# Patient Record
Sex: Female | Born: 1985 | Race: Black or African American | Hispanic: No | Marital: Single | Smoking: Current some day smoker
Health system: Southern US, Community
[De-identification: ages and names within clinical notes are randomized; demographics above are authoritative.]

## PROBLEM LIST (undated history)

## (undated) DIAGNOSIS — Z789 Other specified health status: Secondary | ICD-10-CM

## (undated) DIAGNOSIS — L0232 Furuncle of buttock: Secondary | ICD-10-CM

## (undated) HISTORY — DX: Other specified health status: Z78.9

## (undated) HISTORY — PX: INCISE AND DRAIN ABCESS: PRO64

---
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
## 2008-05-23 ENCOUNTER — Ambulatory Visit: Payer: Self-pay | Admitting: Obstetrics and Gynecology

## 2008-05-23 ENCOUNTER — Inpatient Hospital Stay (HOSPITAL_COMMUNITY): Admission: AD | Admit: 2008-05-23 | Discharge: 2008-05-23 | Payer: Self-pay | Admitting: Obstetrics & Gynecology

## 2009-04-12 ENCOUNTER — Ambulatory Visit: Payer: Self-pay | Admitting: Obstetrics and Gynecology

## 2009-04-13 ENCOUNTER — Encounter: Payer: Self-pay | Admitting: Obstetrics and Gynecology

## 2009-04-13 LAB — CONVERTED CEMR LAB
HCT: 36.8 % (ref 36.0–46.0)
Hemoglobin: 12 g/dL (ref 12.0–15.0)
MCV: 88.7 fL (ref 78.0–100.0)
Platelets: 293 10*3/uL (ref 150–400)
RBC: 4.15 M/uL (ref 3.87–5.11)
WBC: 7.1 10*3/uL (ref 4.0–10.5)

## 2009-04-18 ENCOUNTER — Ambulatory Visit (HOSPITAL_COMMUNITY): Admission: RE | Admit: 2009-04-18 | Discharge: 2009-04-18 | Payer: Self-pay | Admitting: Obstetrics and Gynecology

## 2009-05-03 ENCOUNTER — Ambulatory Visit: Payer: Self-pay | Admitting: Obstetrics and Gynecology

## 2010-06-14 LAB — URINALYSIS, ROUTINE W REFLEX MICROSCOPIC
Ketones, ur: NEGATIVE mg/dL
Leukocytes, UA: NEGATIVE
Nitrite: NEGATIVE
Protein, ur: NEGATIVE mg/dL
Specific Gravity, Urine: 1.02 (ref 1.005–1.030)
Urobilinogen, UA: 0.2 mg/dL (ref 0.0–1.0)
pH: 6 (ref 5.0–8.0)

## 2010-06-14 LAB — WET PREP, GENITAL

## 2010-06-14 LAB — URINE MICROSCOPIC-ADD ON

## 2010-07-20 NOTE — Op Note (Signed)
NAME:  Alexis [name], Alexis [name]                        ACCOUNT NO.:  [id number]   MEDICAL RECORD NO.:  [id number]                   PATIENT TYPE:  AMB   LOCATION:  DSC                                  FACILITY:  [facility]   PHYSICIAN:  Leonia Corona, M.D.               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   POSTOPERATIVE DIAGNOSIS:  Pilonidal cyst and sinus with history of  infection.   PROCEDURE PERFORMED:  Excision of pilonidal cyst and sinus.   ANESTHESIA:  General endotracheal anesthesia.   SURGEON:  Leonia Corona, M.D.   ASSISTANT:  Nurse.   INDICATIONS FOR PROCEDURE:  This 25 year old female child was seen in the  office for painful cystic swelling with discharge from the lower back in the  midline clinically indicative of pilonidal cyst with visible sinus and  drainage.  The patient was treated with antibiotic.  Now, the infection has  subsided and the patient was ready for excision of the cyst and sinus.   PROCEDURE IN DETAIL:  The patient is brought in the operating room, general  endotracheal anesthesia was induced on the patient's stretcher and the  patient was later placed in a prone position on the operating table.  All  the pressure points were well protected with necessary padding.  The tapes  were applied over the buttocks to spread and expose the pilonidal cyst  completely.  The area was cleaned, prepped and draped in the usual manner.  The cyst was marked with a marking pen.  An elliptical vertical incision was  marked and closing the sinus at the lower end of the elliptical incision.  The incision was made with the knife, deepened through the subcutaneous  tissue using electrocautery.  Further deeper dissection was carried out by  blunt and sharp dissection using cautery to control the bleeding.   The  elliptical piece of skin was dissected along with the entire cyst in one  piece.  A blunt tip probe was passed through the sinus and it went into the  cyst which was very fragile and friable wall in the lower part of the  dissection which was removed along with the subcutaneous fat as far deep as  the bone.  Little pieces of the fragile wall of the cyst had to be curettage  since it was very friable and did not come out in one piece.  The area was  once again inspected and complete curettage was done and no nonhealthy  tissue was noted, no cyst wall was left behind, the entire cyst wall was  excised completely.  The bleeding spots were picked up and cauterized.  The  cavity created measured about 6-7 cm in length and about 4-5 cm wide.  In  view  of the infection, we decided to keep the cavity wide open with packing.  Dilute hydrogen peroxide irrigation of the cavity was done and suctioned out  completely and no active bleeders or oozing was noted.  The cavity was  packed with 1 inch Iodoform gauze.  Tight packing was done.  Two loose  stitches using 0 nylon were placed in upper and lower end of the elliptical  wound to be tied later on which was left on a 2 by 2 buttress and placed on  the skin with Tegaderm dressing.  The open wound with  packing was covered with a sterile gauze and ABD pad and secured with  Hypofix dressing.  The patient tolerated the procedure well which was smooth  and uneventful.  The patient was later extubated and transferred to the  recovery room in good, stable condition.                                               Leonia Corona, M.D.    SF/MEDQ  D:  10/21/2002  T:  10/21/2002  Job:  [id number]   cc:   Melissa Noon, M.D.

## 2011-01-02 ENCOUNTER — Encounter (HOSPITAL_COMMUNITY): Payer: Self-pay | Admitting: *Deleted

## 2011-01-02 ENCOUNTER — Inpatient Hospital Stay (HOSPITAL_COMMUNITY): Payer: Self-pay

## 2011-01-02 ENCOUNTER — Inpatient Hospital Stay (HOSPITAL_COMMUNITY)
Admission: AD | Admit: 2011-01-02 | Discharge: 2011-01-02 | Disposition: A | Discharge status: Home / Self Care | Payer: Self-pay | Source: Ambulatory Visit | Attending: Obstetrics and Gynecology | Admitting: Obstetrics and Gynecology

## 2011-01-02 ENCOUNTER — Inpatient Hospital Stay (INDEPENDENT_AMBULATORY_CARE_PROVIDER_SITE_OTHER)
Admission: RE | Admit: 2011-01-02 | Discharge: 2011-01-02 | Disposition: A | Discharge status: Home / Self Care | Payer: Self-pay | Source: Ambulatory Visit | Attending: Family Medicine | Admitting: Family Medicine

## 2011-01-02 DIAGNOSIS — O30009 Twin pregnancy, unspecified number of placenta and unspecified number of amniotic sacs, unspecified trimester: Secondary | ICD-10-CM | POA: Insufficient documentation

## 2011-01-02 DIAGNOSIS — O30001 Twin pregnancy, unspecified number of placenta and unspecified number of amniotic sacs, first trimester: Secondary | ICD-10-CM

## 2011-01-02 DIAGNOSIS — R1032 Left lower quadrant pain: Secondary | ICD-10-CM

## 2011-01-02 DIAGNOSIS — R109 Unspecified abdominal pain: Secondary | ICD-10-CM | POA: Insufficient documentation

## 2011-01-02 DIAGNOSIS — N949 Unspecified condition associated with female genital organs and menstrual cycle: Secondary | ICD-10-CM

## 2011-01-02 DIAGNOSIS — O9989 Other specified diseases and conditions complicating pregnancy, childbirth and the puerperium: Secondary | ICD-10-CM

## 2011-01-02 DIAGNOSIS — O26899 Other specified pregnancy related conditions, unspecified trimester: Secondary | ICD-10-CM

## 2011-01-02 DIAGNOSIS — O99891 Other specified diseases and conditions complicating pregnancy: Secondary | ICD-10-CM | POA: Insufficient documentation

## 2011-01-02 DIAGNOSIS — Z331 Pregnant state, incidental: Secondary | ICD-10-CM

## 2011-01-02 HISTORY — DX: Furuncle of buttock: L02.32

## 2011-01-02 LAB — POCT URINALYSIS DIP (DEVICE)
Bilirubin Urine: NEGATIVE
Glucose, UA: NEGATIVE mg/dL
Hgb urine dipstick: NEGATIVE
Leukocytes, UA: NEGATIVE
Protein, ur: NEGATIVE mg/dL
Specific Gravity, Urine: 1.015 (ref 1.005–1.030)

## 2011-01-02 LAB — ABO/RH: ABO/RH(D): A POS

## 2011-01-02 LAB — CBC
MCH: 30.9 pg (ref 26.0–34.0)
MCHC: 34 g/dL (ref 30.0–36.0)
Platelets: 272 10*3/uL (ref 150–400)
RBC: 3.79 MIL/uL — ABNORMAL LOW (ref 3.87–5.11)
RDW: 14.1 % (ref 11.5–15.5)

## 2011-01-02 LAB — HCG, QUANTITATIVE, PREGNANCY: hCG, Beta Chain, Quant, S: 18400 m[IU]/mL — ABNORMAL HIGH (ref <5)

## 2011-01-02 LAB — WET PREP, GENITAL: Trich, Wet Prep: NONE SEEN

## 2011-01-02 IMAGING — US US OB COMP LESS 14 WK
1 series · 14 of 28 positions shown · non-contrast
Comparison: None.

CLINICAL DATA: Pelvic pain.  Pregnant.

OBSTETRIC <14 WK US AND TRANSVAGINAL OB US
TECHNIQUE: Both transabdominal and transvaginal ultrasound
examinations were performed for complete evaluation of the
gestation as well as the maternal uterus, adnexal regions, and
pelvic cul-de-sac.  Transvaginal technique was performed to assess
early pregnancy.

[Series 1: us ob comp less 14 wks · 49 acquisitions, 14 frames shown]
[im 2/49]
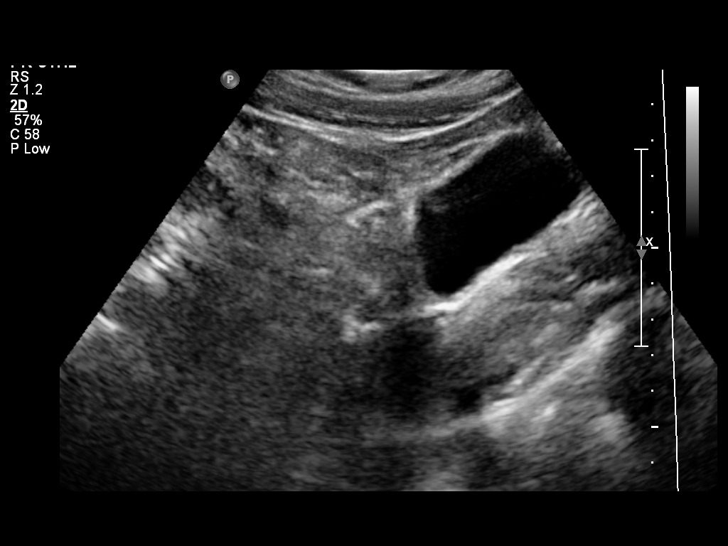
[im 6/49]
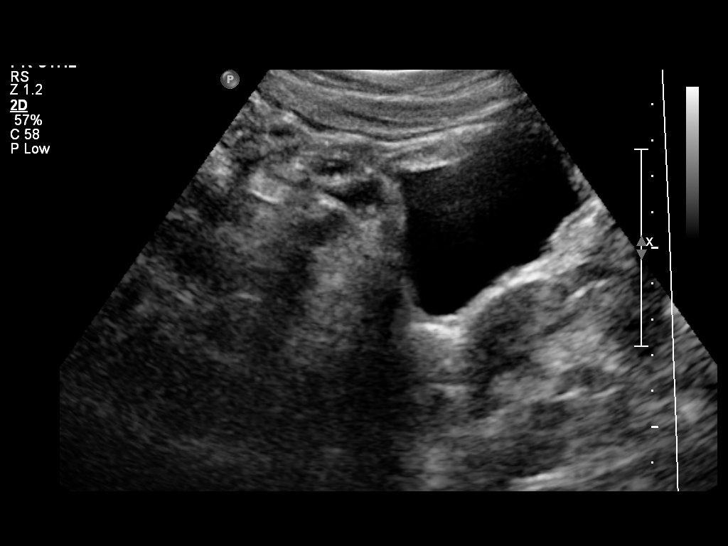
[im 9/49]
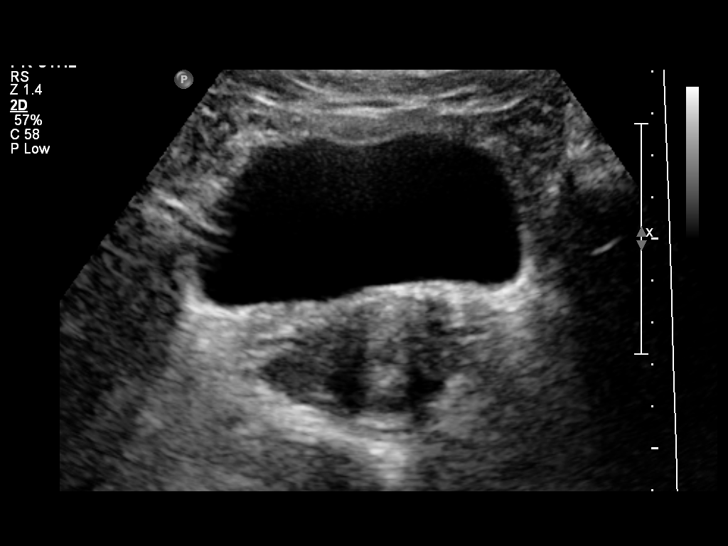
[im 13/49]
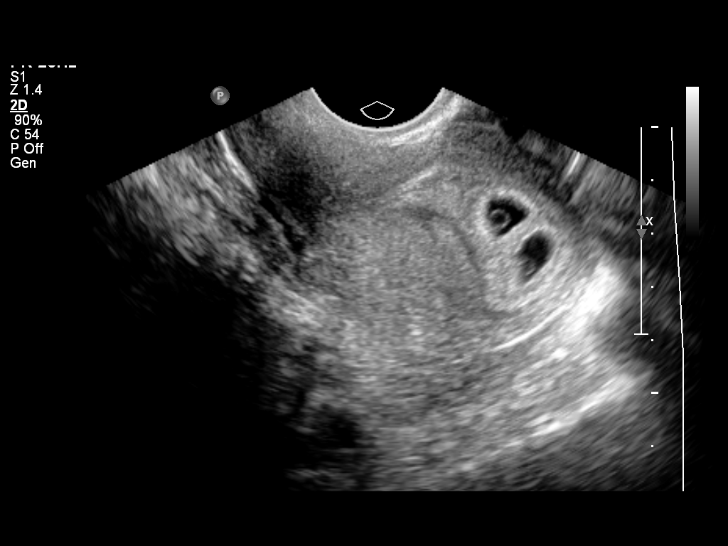
[im 17/49]
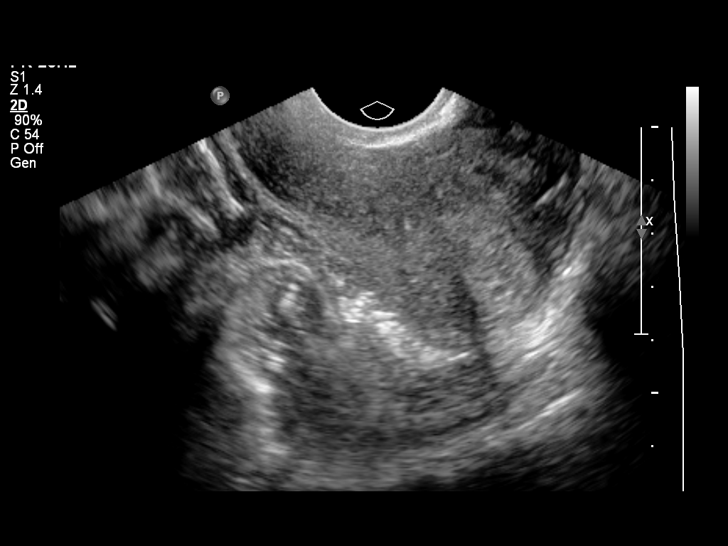
[im 20/49]
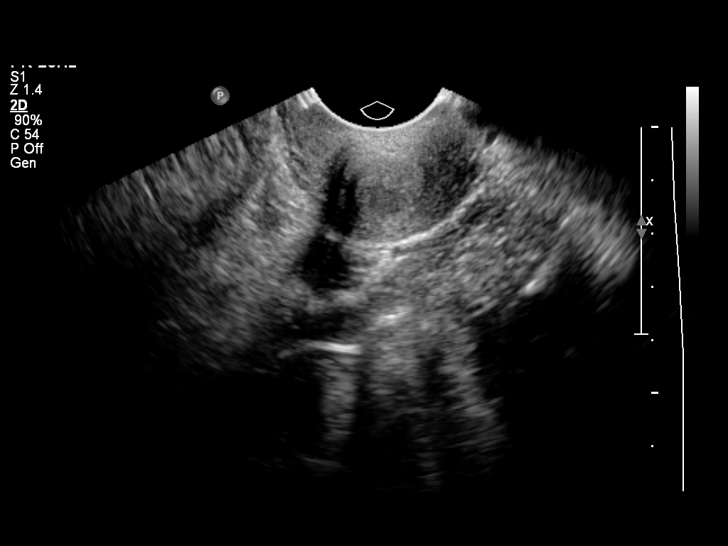
[im 24/49]
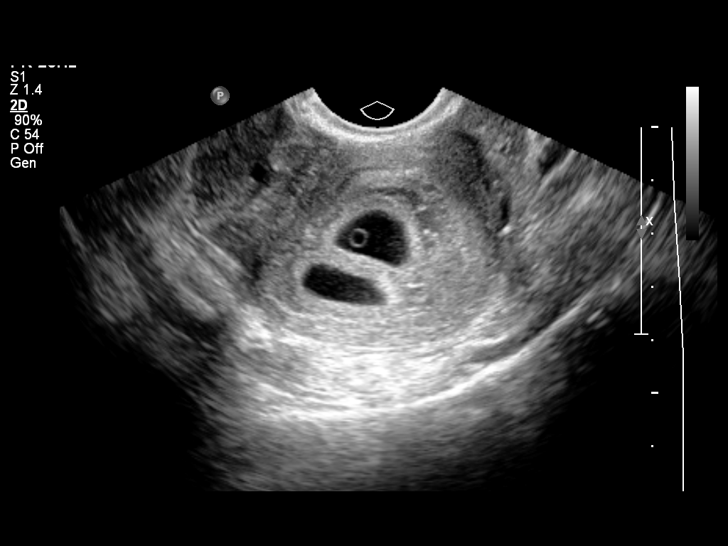
[im 27/49]
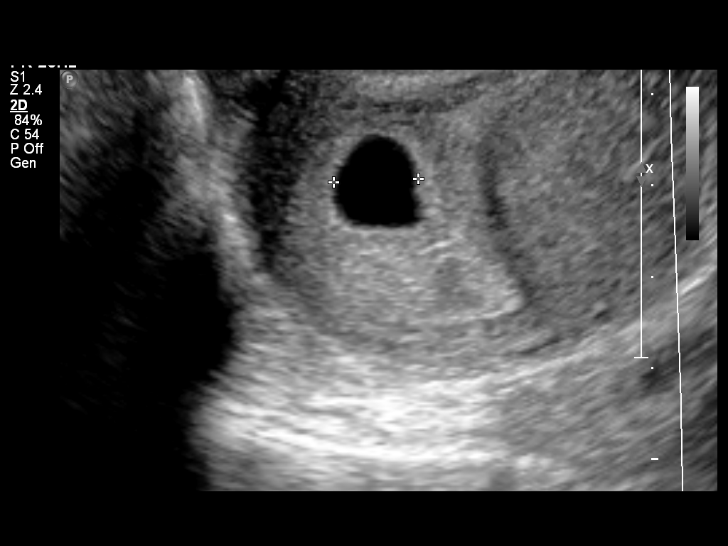
[im 31/49]
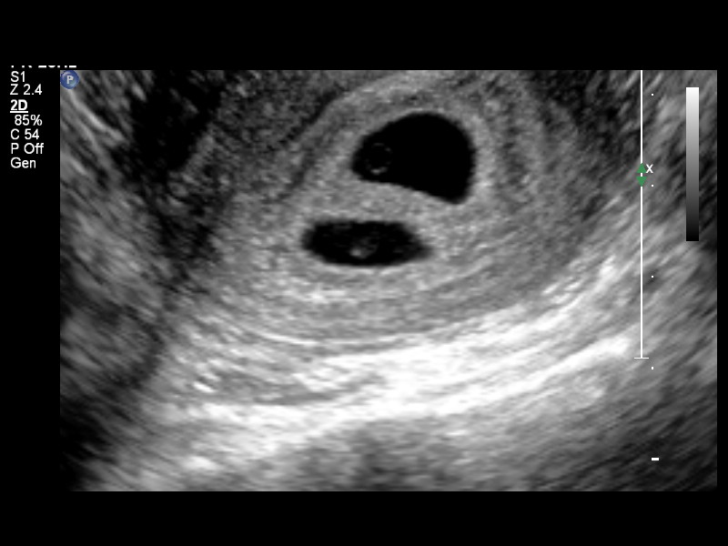
[im 34/49]
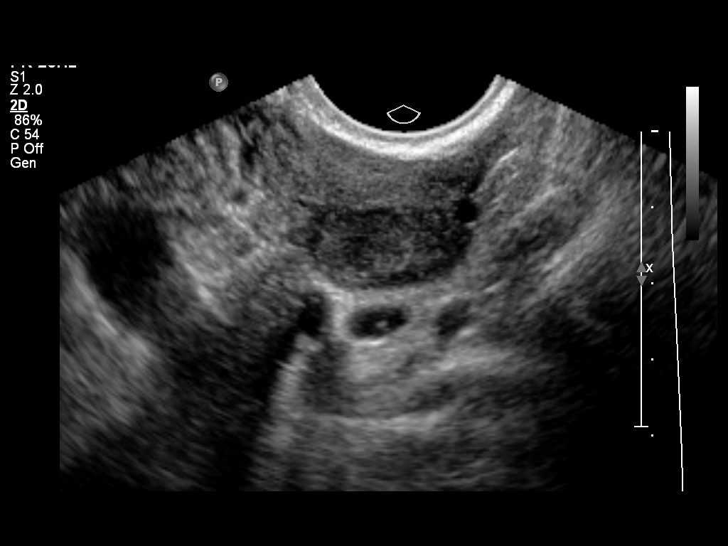
[im 38/49]
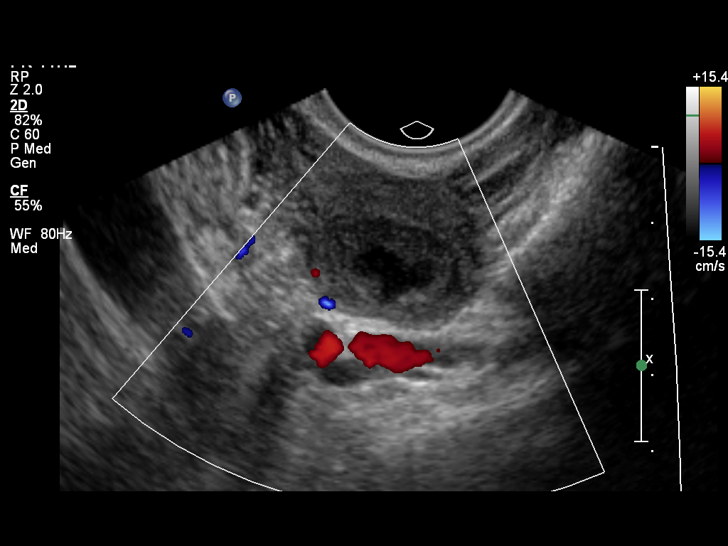
[im 41/49]
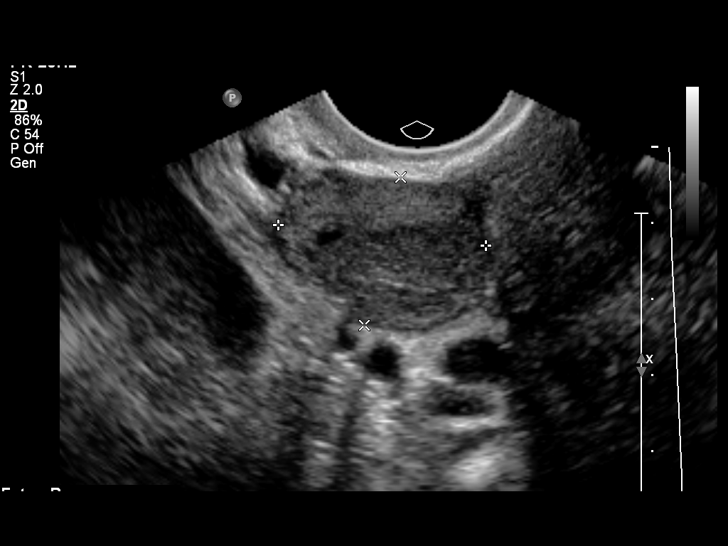
[im 45/49]
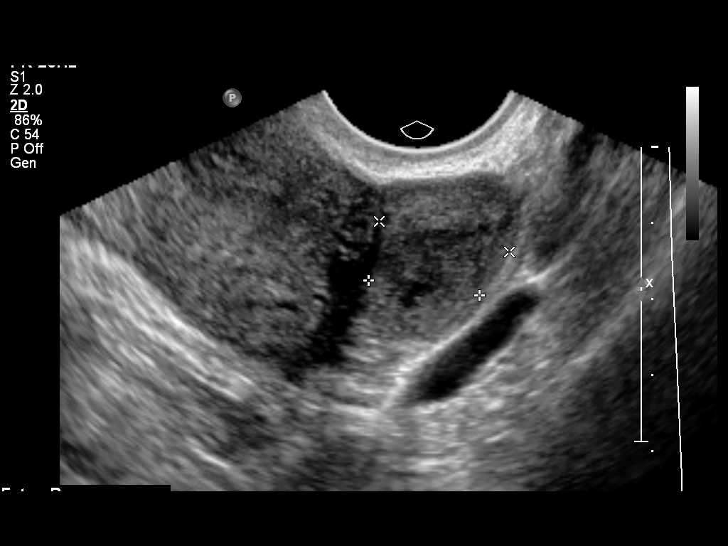
[im 49/49]
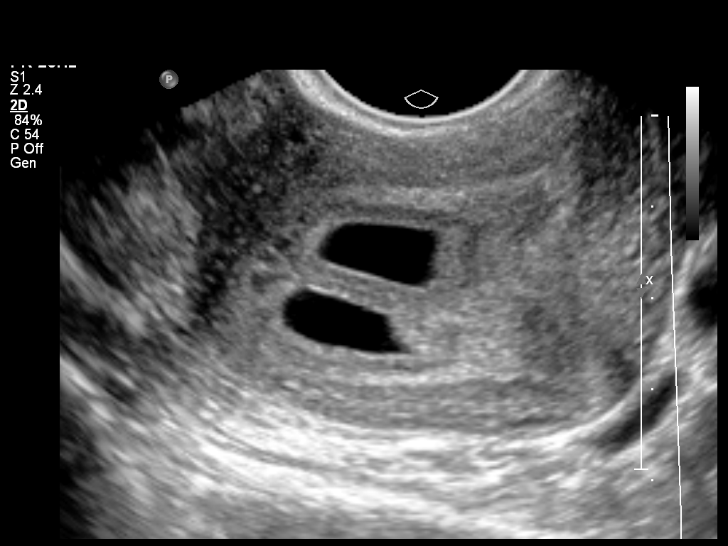

[14 of 28 positions shown; findings below may reference images not displayed]

Intrauterine gestational sac:  Two intrauterine gestational sacs.
Yolk sac: Present x2.
Embryo: None
Cardiac Activity: N/A
Heart Rate: N/A bpm

MSD: 10.3  mm  5    w 5    d.  Both gestational sacs are identical
in size.          US EDC: [DATE].

Maternal uterus/adnexae:
No subchorionic hemorrhage.
Normal ovaries.  Corpus luteum cysts noted.
No free pelvic fluid collections.
IMPRESSION: Twin pregnancy with two separate gestational sacs and yolk sacs but
no embryo identified.  Gestational sacs estimated 5 weeks and 5
days gestation.

## 2011-01-02 NOTE — Progress Notes (Signed)
"  I have been having LT side and lower abd pain for 2 weeks.  I had a (+) pregnancy test at home and went to Urgent Care just to confirm and make sure the test I took at home wasn't wrong.  I just left [facility].  They told me to come directly here for evaluation."

## 2011-01-02 NOTE — Progress Notes (Signed)
Pt states she thought she was pregnant and went to [facility]-they confirmed the preg. And told her to come here for further eval of her pain

## 2011-01-02 NOTE — ED Provider Notes (Signed)
History     Chief Complaint  Patient presents with  . Abdominal Pain   HPIRaquel S Barnett25 y.o. presents after being seen at  [facility] for abdominal pain.  She suspected she was pregnant.  They sent her here for evaluation of her pain.  Pain began 2 weeks ago.  Describes as "it is like someone is pushing on my stomach real hard".  LMP 9/26.  G1.  Was not using. Denies vaginal bleeding.  Has not taken anything for the pain.  Nauseated X 1 week but denies vomiting.  Plans care at [facility].       Past Medical History  Diagnosis Date  . Boil of buttock 2002    at the top between buttocks, abcess I&D    No past surgical history on file.  No family history on file.  History  Substance Use Topics  . Smoking status: Not on file  . Smokeless tobacco: Not on file  . Alcohol Use:     Allergies: Allergies not on file  No prescriptions prior to admission    Review of Systems  Constitutional: Negative.   Gastrointestinal: Negative for vomiting.  Genitourinary: Negative.        Negative for vaginal bleeding and vaginal discharge   Physical Exam   Blood pressure 112/77, pulse 72, temperature 97.9 F (36.6 C), resp. rate 20, height 5\' 3"  (1.6 m), weight 177 lb (80.287 kg), last menstrual period 11/28/2010.  Physical Exam  Constitutional: She is oriented to person, place, and time. She appears well-developed and well-nourished. No distress.  HENT:  Head: Normocephalic.  Neck: Normal range of motion.  Cardiovascular: Normal rate.   Respiratory: Effort normal.  GI: Soft. She exhibits no distension. There is no tenderness. There is no rebound and no guarding.  Genitourinary: Uterus is enlarged. Uterus is not tender. Cervix exhibits friability. Right adnexum displays no mass, no tenderness and no fullness. Left adnexum displays no mass, no tenderness and no fullness. No tenderness or bleeding around the vagina. Vaginal discharge (small amount of white discharge without odor) found.   Neurological: She is alert and oriented to person, place, and time.  Skin: Skin is warm and dry.    Study Result     *RADIOLOGY REPORT*  Clinical Data: Pelvic pain. Pregnant.  OBSTETRIC <14 WK [place] AND TRANSVAGINAL OB US  Technique: Both transabdominal and transvaginal ultrasound  examinations were performed for complete evaluation of the  gestation as well as the maternal uterus, adnexal regions, and  pelvic cul-de-sac. Transvaginal technique was performed to assess  early pregnancy.  Comparison: None.  Intrauterine gestational sac: Two intrauterine gestational sacs.  Yolk sac: Present x2.  Embryo: None  Cardiac Activity: N/A  Heart Rate: N/A bpm  MSD: 10.3 mm 5 w 5 d. Both gestational sacs are identical  in size. [place] EDC: 08/30/2011.  Maternal uterus/adnexae:  No subchorionic hemorrhage.  Normal ovaries. Corpus luteum cysts noted.  No free pelvic fluid collections.  IMPRESSION:  Twin pregnancy with two separate gestational sacs and yolk sacs but  no embryo identified. Gestational sacs estimated 5 weeks and 5  days gestation.  Original Report Authenticated By: P. Loralie Champagne, M.D.      Imaging    Results for orders placed during the hospital encounter of 01/02/11 (from the past 24 hour(s))  WET PREP, GENITAL     Status: Abnormal   Collection Time   01/02/11  8:05 PM      Component Value Range   Yeast, Wet  Prep NONE SEEN  NONE SEEN    Trich, Wet Prep NONE SEEN  NONE SEEN    Clue Cells, Wet Prep FEW (*) NONE SEEN    WBC, Wet Prep HPF POC FEW (*) NONE SEEN   CBC     Status: Abnormal   Collection Time   01/02/11  8:10 PM      Component Value Range   WBC 8.3  4.0 - 10.5 (K/uL)   RBC 3.79 (*) 3.87 - 5.11 (MIL/uL)   Hemoglobin 11.7 (*) 12.0 - 15.0 (g/dL)   HCT 40.9 (*) 81.1 - 46.0 (%)   MCV 90.8  78.0 - 100.0 (fL)   MCH 30.9  26.0 - 34.0 (pg)   MCHC 34.0  30.0 - 36.0 (g/dL)   RDW 91.4  78.2 - 95.6 (%)   Platelets 272  150 - 400 (K/uL)  ABO/RH     Status: Normal     Collection Time   01/02/11  8:10 PM      Component Value Range   ABO/RH(D) A POS     BHCG Pending at the time of discharge.  MAU Course  Procedures  GC/CHl culture to lab  MDM  Assessment and Plan  A:  Twin pregnancy at [redacted]w[redacted]d  P:  Begin prenatal care as planned.  Pt aware we will report + Cultures.    Damoni Causby,EVE M 01/02/2011, 7:46 PM   Matt Holmes, NP 01/02/11 2054

## 2011-01-07 ENCOUNTER — Encounter (HOSPITAL_COMMUNITY): Payer: Self-pay | Admitting: *Deleted

## 2011-01-28 ENCOUNTER — Other Ambulatory Visit (HOSPITAL_COMMUNITY): Payer: Self-pay | Admitting: Physician Assistant

## 2011-01-28 DIAGNOSIS — O3680X0 Pregnancy with inconclusive fetal viability, not applicable or unspecified: Secondary | ICD-10-CM

## 2011-01-28 LAB — HEPATITIS B SURFACE ANTIGEN: Hepatitis B Surface Ag: NEGATIVE

## 2011-01-28 LAB — RUBELLA ANTIBODY, IGM: Rubella: IMMUNE

## 2011-01-30 ENCOUNTER — Ambulatory Visit (HOSPITAL_COMMUNITY)
Admission: RE | Admit: 2011-01-30 | Discharge: 2011-01-30 | Disposition: A | Discharge status: Home / Self Care | Payer: Medicaid Other | Source: Ambulatory Visit | Attending: Physician Assistant | Admitting: Physician Assistant

## 2011-01-30 ENCOUNTER — Other Ambulatory Visit (HOSPITAL_COMMUNITY): Payer: Self-pay | Admitting: Physician Assistant

## 2011-01-30 DIAGNOSIS — O3680X0 Pregnancy with inconclusive fetal viability, not applicable or unspecified: Secondary | ICD-10-CM

## 2011-01-30 DIAGNOSIS — Z3689 Encounter for other specified antenatal screening: Secondary | ICD-10-CM | POA: Insufficient documentation

## 2011-01-30 DIAGNOSIS — Z3682 Encounter for antenatal screening for nuchal translucency: Secondary | ICD-10-CM

## 2011-01-30 DIAGNOSIS — O30009 Twin pregnancy, unspecified number of placenta and unspecified number of amniotic sacs, unspecified trimester: Secondary | ICD-10-CM | POA: Insufficient documentation

## 2011-02-12 DIAGNOSIS — O30009 Twin pregnancy, unspecified number of placenta and unspecified number of amniotic sacs, unspecified trimester: Secondary | ICD-10-CM | POA: Insufficient documentation

## 2011-02-14 ENCOUNTER — Ambulatory Visit (INDEPENDENT_AMBULATORY_CARE_PROVIDER_SITE_OTHER): Payer: Medicaid Other | Admitting: Obstetrics and Gynecology

## 2011-02-14 DIAGNOSIS — Z7722 Contact with and (suspected) exposure to environmental tobacco smoke (acute) (chronic): Secondary | ICD-10-CM

## 2011-02-14 DIAGNOSIS — O30009 Twin pregnancy, unspecified number of placenta and unspecified number of amniotic sacs, unspecified trimester: Secondary | ICD-10-CM

## 2011-02-14 DIAGNOSIS — T59811A Toxic effect of smoke, accidental (unintentional), initial encounter: Secondary | ICD-10-CM

## 2011-02-14 DIAGNOSIS — O0990 Supervision of high risk pregnancy, unspecified, unspecified trimester: Secondary | ICD-10-CM | POA: Insufficient documentation

## 2011-02-14 LAB — POCT URINALYSIS DIP (DEVICE)
Hgb urine dipstick: NEGATIVE
Nitrite: NEGATIVE
Urobilinogen, UA: 1 mg/dL (ref 0.0–1.0)
pH: 6 (ref 5.0–8.0)

## 2011-02-14 MED ORDER — FERROUS SULFATE 325 (65 FE) MG PO TABS: 325.0000 mg | ORAL_TABLET | Freq: Two times a day (BID) | ORAL | Status: DC

## 2011-02-14 NOTE — Progress Notes (Signed)
Nutrition Note: (Referral for 1st [facility] visit; pt receives [facility] services) Dx. Twin gestation, low iron, obesity w/ wt loss. Pt currently has an 11# wt loss at [redacted]w[redacted]d gestation.   Pt reports vomiting only 1x per week, good appetite with at least 4 meals/ 24 hours.  Disc overall wt gain goals of 25-42# for twin gestation.  Pt will continue intake as reported, enc increased intake to 5 smaller meals, when able.  Follow up in 4 weeks.  Cy Blamer, RD

## 2011-02-14 NOTE — Progress Notes (Signed)
Patient doing well without complaints. Denies nausea or emesis. Patient counseled on twin pregnancy with the need for extra 300 kcal/day, and iron supplementation as well as risk of PTL. Patient advised to avoid second hand smoke as much as possible. Patient interested in first trimester screen which is scheduled for 12/20.

## 2011-02-14 NOTE — Patient Instructions (Signed)
Multiple Pregnancy A multiple pregnancy is when a woman is pregnant with two or more fetuses. Multiple pregnancies occur in about 3% of all births. The more babies in a pregnancy, the greater the risks of problems to the babies and mother. This includes death. Since the use of Assisted Reproductive Technology (ART) and medications that can induce ovulation, multiple fetal gestation has increased.  RISKS TO THE MOTHER  Preeclampsia and eclampsia.   Postpartum bleeding (hemorrhage).   Kidney infection (pyelonephritis).   Develop anemia.   Develop diabetes.   Liver complications.   A blood clot blocks the artery, or branch of the artery leading to the lungs (pulmonary embolism).   Blood clots in the leg.   Placental separation.   Higher rate of Cesarean Section deliveries.   Women over 51 years old have a higher rate of Downs Syndrome babies.  RISKS TO THE BABY  Preterm labor with a premature baby.   Very low birth weight babies that are less than 3 pounds, especially with triplets or mores.   Premature rupture of the membranes.   Twin to twin blood transfusion with one baby anemic and the other baby with too much blood in its system. There may also be heart failure.   With triplets or more, one of the babies is at high risk for cerebral palsy or other neurologic problem.   There is a higher incidence of fetal death.  CARE OF MOTHERS WITH MULTIPLE FETAL GESTATION Multiple pregnancies need more care and special prenatal care.  You will see your caregiver more often.   You will have more tests including ultrasounds, nonstress tests and blood tests.   You will have special tests done called amniocentesis and a biophysical profile.   You may be hospitalized more often during the pregnancy.   You will be encouraged to eat a balanced and healthy diet with vitamin and mineral supplements as directed.   You will be asked to get more rest and sleep to keep up your energy.    You will be asked to restrict your daily activities, exercise, work, household chores and sexual activity.   If you have preterm labor with small babies, you will be given a steroid injection to help the babies lungs mature and do better when born.   The delivery may have to be by Cesarean delivery, especially if there are triplets or more.   The delivery should be in a hospital with an intensive care nursery and Neonatologists (pediatrician for high risk babies) to care for the newborn babies.  HOME CARE INSTRUCTIONS   Follow the caregiver's recommendations regarding office visits, tests for you and the babies, diet, rest and medications.   Avoid a large amount of physical activity.   Arrange to have help after the babies are born and when you go home from the hospital.   Take classes on how to care for multiple babies before you deliver them.  SEEK IMMEDIATE MEDICAL CARE IF:   You develop a temperature of 102 F (38.9 C) or higher.   You are leaking fluid from the vagina.   You develop vaginal bleeding.   You develop uterine contractions.   You develop a severe headache, severe upper abdominal pain, visual problems or excessive swelling of your face, hands and feet.   You develop severe back pain or leg pain.   You develop severe tiredness.   You develop chest pain.   You have shortness of breath, fall down or pass out.  Document Released: 11/28/2007 Document Revised: 10/31/2010 Document Reviewed: 11/28/2007  Pregnancy - First Trimester During sexual intercourse, millions of sperm go into the vagina. Only 1 sperm will penetrate and fertilize the female egg while it is in the Fallopian tube. One week later, the fertilized egg implants into the wall of the uterus. An embryo begins to develop into a baby. At 6 to 8 weeks, the eyes and face are formed and the heartbeat can be seen on ultrasound. At the end of 12 weeks (first trimester), all the baby's organs are formed. Now  that you are pregnant, you will want to do everything you can to have a healthy baby. Two of the most important things are to get good prenatal care and follow your caregiver's instructions. Prenatal care is all the medical care you receive before the baby's birth. It is given to prevent, find, and treat problems during the pregnancy and childbirth. PRENATAL EXAMS  During prenatal visits, your weight, blood pressure and urine are checked. This is done to make sure you are healthy and progressing normally during the pregnancy.   A pregnant woman should gain 25 to 35 pounds during the pregnancy. However, if you are over weight or underweight, your caregiver will advise you regarding your weight.   Your caregiver will ask and answer questions for you.   Blood work, cervical cultures, other necessary tests and a Pap test are done during your prenatal exams. These tests are done to check on your health and the probable health of your baby. Tests are strongly recommended and done for HIV with your permission. This is the virus that causes AIDS. These tests are done because medications can be given to help prevent your baby from being born with this infection should you have been infected without knowing it. Blood work is also used to find out your blood type, previous infections and follow your blood levels (hemoglobin).   Low hemoglobin (anemia) is common during pregnancy. Iron and vitamins are given to help prevent this. Later in the pregnancy, blood tests for diabetes will be done along with any other tests if any problems develop. You may need tests to make sure you and the baby are doing well.   You may need other tests to make sure you and the baby are doing well.  CHANGES DURING THE FIRST TRIMESTER (THE FIRST 3 MONTHS OF PREGNANCY) Your body goes through many changes during pregnancy. They vary from person to person. Talk to your caregiver about changes you notice and are concerned about. Changes can  include:  Your menstrual period stops.   The egg and sperm carry the genes that determine what you look like. Genes from you and your partner are forming a baby. The female genes determine whether the baby is a boy or a girl.   Your body increases in girth and you may feel bloated.   Feeling sick to your stomach (nauseous) and throwing up (vomiting). If the vomiting is uncontrollable, call your caregiver.   Your breasts will begin to enlarge and become tender.   Your nipples may stick out more and become darker.   The need to urinate more. Painful urination may mean you have a bladder infection.   Tiring easily.   Loss of appetite.   Cravings for certain kinds of food.   At first, you may gain or lose a couple of pounds.   You may have changes in your emotions from day to day (excited to be pregnant or  concerned something may go wrong with the pregnancy and baby).   You may have more vivid and strange dreams.  HOME CARE INSTRUCTIONS   It is very important to avoid all smoking, alcohol and un-prescribed drugs during your pregnancy. These affect the formation and growth of the baby. Avoid chemicals while pregnant to ensure the delivery of a healthy infant.   Start your prenatal visits by the 12th week of pregnancy. They are usually scheduled monthly at first, then more often in the last 2 months before delivery. Keep your caregiver's appointments. Follow your caregiver's instructions regarding medication use, blood and lab tests, exercise, and diet.   During pregnancy, you are providing food for you and your baby. Eat regular, well-balanced meals. Choose foods such as meat, fish, milk and other low fat dairy products, vegetables, fruits, and whole-grain breads and cereals. Your caregiver will tell you of the ideal weight gain.   You can help morning sickness by keeping soda crackers at the bedside. Eat a couple before arising in the morning. You may want to use the crackers without  salt on them.   Eating 4 to 5 small meals rather than 3 large meals a day also may help the nausea and vomiting.   Drinking liquids between meals instead of during meals also seems to help nausea and vomiting.   A physical sexual relationship may be continued throughout pregnancy if there are no other problems. Problems may be early (premature) leaking of amniotic fluid from the membranes, vaginal bleeding, or belly (abdominal) pain.   Exercise regularly if there are no restrictions. Check with your caregiver or physical therapist if you are unsure of the safety of some of your exercises. Greater weight gain will occur in the last 2 trimesters of pregnancy. Exercising will help:   Control your weight.   Keep you in shape.   Prepare you for labor and delivery.   Help you lose your pregnancy weight after you deliver your baby.   Wear a good support or jogging bra for breast tenderness during pregnancy. This may help if worn during sleep too.   Ask when prenatal classes are available. Begin classes when they are offered.   Do not use hot tubs, steam rooms or saunas.   Wear your seat belt when driving. This protects you and your baby if you are in an accident.   Avoid raw meat, uncooked cheese, cat litter boxes and soil used by cats throughout the pregnancy. These carry germs that can cause birth defects in the baby.   The first trimester is a good time to visit your dentist for your dental health. Getting your teeth cleaned is OK. Use a softer toothbrush and brush gently during pregnancy.   Ask for help if you have financial, counseling or nutritional needs during pregnancy. Your caregiver will be able to offer counseling for these needs as well as refer you for other special needs.   Do not take any medications or herbs unless told by your caregiver.   Inform your caregiver if there is any mental or physical domestic violence.   Make a list of emergency phone numbers of family,  friends, hospital, and police and fire departments.   Write down your questions. Take them to your prenatal visit.   Do not douche.   Do not cross your legs.   If you have to stand for long periods of time, rotate you feet or take small steps in a circle.   You may have  more vaginal secretions that may require a sanitary pad. Do not use tampons or scented sanitary pads.  MEDICATIONS AND DRUG USE IN PREGNANCY  Take prenatal vitamins as directed. The vitamin should contain 1 milligram of folic acid. Keep all vitamins out of reach of children. Only a couple vitamins or tablets containing iron may be fatal to a baby or young child when ingested.   Avoid use of all medications, including herbs, over-the-counter medications, not prescribed or suggested by your caregiver. Only take over-the-counter or prescription medicines for pain, discomfort, or fever as directed by your caregiver. Do not use aspirin, ibuprofen, or naproxen unless directed by your caregiver.   Let your caregiver also know about herbs you may be using.   Alcohol is related to a number of birth defects. This includes fetal alcohol syndrome. All alcohol, in any form, should be avoided completely. Smoking will cause low birth rate and premature babies.   Street or illegal drugs are very harmful to the baby. They are absolutely forbidden. A baby born to an addicted mother will be addicted at birth. The baby will go through the same withdrawal an adult does.   Let your caregiver know about any medications that you have to take and for what reason you take them.  MISCARRIAGE IS COMMON DURING PREGNANCY A miscarriage does not mean you did something wrong. It is not a reason to worry about getting pregnant again. Your caregiver will help you with questions you may have. If you have a miscarriage, you may need minor surgery. SEEK MEDICAL CARE IF:  You have any concerns or worries during your pregnancy. It is better to call with your  questions if you feel they cannot wait, rather than worry about them. SEEK IMMEDIATE MEDICAL CARE IF:   An unexplained oral temperature above 102 F (38.9 C) develops, or as your caregiver suggests.   You have leaking of fluid from the vagina (birth canal). If leaking membranes are suspected, take your temperature and inform your caregiver of this when you call.   There is vaginal spotting or bleeding. Notify your caregiver of the amount and how many pads are used.   You develop a bad smelling vaginal discharge with a change in the color.   You continue to feel sick to your stomach (nauseated) and have no relief from remedies suggested. You vomit blood or coffee ground-like materials.   You lose more than 2 pounds of weight in 1 week.   You gain more than 2 pounds of weight in 1 week and you notice swelling of your face, hands, feet, or legs.   You gain 5 pounds or more in 1 week (even if you do not have swelling of your hands, face, legs, or feet).   You get exposed to [place] measles and have never had them.   You are exposed to fifth disease or chickenpox.   You develop belly (abdominal) pain. Round ligament discomfort is a common non-cancerous (benign) cause of abdominal pain in pregnancy. Your caregiver still must evaluate this.   You develop headache, fever, diarrhea, pain with urination, or shortness of breath.   You fall or are in a car accident or have any kind of trauma.   There is mental or physical violence in your home.  Document Released: 02/12/2001 Document Revised: 10/31/2010 Document Reviewed: 08/16/2008 [facility] Patient Information 2012 [place], [place]. [employer] Patient Information 2012 [place], [place].

## 2011-02-21 ENCOUNTER — Ambulatory Visit (HOSPITAL_COMMUNITY): Payer: Medicaid Other

## 2011-02-21 ENCOUNTER — Ambulatory Visit (HOSPITAL_COMMUNITY): Payer: Medicaid Other | Attending: Physician Assistant

## 2011-03-05 NOTE — L&D Delivery Note (Signed)
  Fonda, Rochon Mylea [[id number]]  Delivery Note At 11:57 AM a viable female was delivered via Vaginal, Spontaneous Delivery (Presentation: Left Occiput Anterior).  APGAR: 5, 7; weight 1 lb 11.2 oz (770 g).   Placenta status: intact, Manual removal after cesarean delivery of Twin B.  Cord:  with the following complications: none .  Anesthesia: None  Episiotomy: None Lacerations: None Est. Blood Loss (mL): see op report  Mom to postpartum.  Baby to NICU.  For delivery of Baby Girl B, see operative report. Lady Wisham JEHIEL 05/22/2011, 1:16 PM

## 2011-03-14 ENCOUNTER — Other Ambulatory Visit: Payer: Self-pay | Admitting: Family Medicine

## 2011-03-14 ENCOUNTER — Ambulatory Visit (INDEPENDENT_AMBULATORY_CARE_PROVIDER_SITE_OTHER): Payer: Medicaid Other | Admitting: Physician Assistant

## 2011-03-14 DIAGNOSIS — O30009 Twin pregnancy, unspecified number of placenta and unspecified number of amniotic sacs, unspecified trimester: Secondary | ICD-10-CM

## 2011-03-14 LAB — POCT URINALYSIS DIP (DEVICE)
Ketones, ur: NEGATIVE mg/dL
Protein, ur: NEGATIVE mg/dL
Specific Gravity, Urine: 1.025 (ref 1.005–1.030)
Urobilinogen, UA: 1 mg/dL (ref 0.0–1.0)
pH: 6 (ref 5.0–8.0)

## 2011-03-14 NOTE — Patient Instructions (Signed)
Breastfeeding, Twins or Multiples Mothers of twins or multiples might feel overwhelmed with the idea of breastfeeding more than one baby at a time. It is easier and less expensive to breastfeed twins than to bottle feed them. This is because you do not need to buy infant formula, wash bottles, buy mild soap, and fill the bottles for more than one baby when it is time to feed them. Human milk is especially important for twins, who are often small at birth and need all the advantages breast milk can provide. Breastfeeding also helps create a unique and special bond between the mother and each of her infants.  Mothers of multiples get more benefits from breastfeeding:  Your uterus contracts and returns to its original size faster. This is helpful because it has stretched even more than normal to hold more than one baby.   Hormones are released that relax the mother. This is helpful with the added stress of caring for more than one infant.   The mother often finds she is saving herself time and money, because there is no need to prepare formula or bottles. Your milk is available whenever your babies are ready to feed, at the right temperature, providing optimal nutrition.  If the babies are premature and unable to nurse, you can pump your breasts and freeze the milk until the babies are ready to feed at the breast. To stimulate a milk supply, your breasts need to be emptied at least 8 to 10 times in a 24 hour period. Ask a lactation specialist to help you choose an effective breast pump and to provide guidance in helping your babies latch onto, and feed from, the breast when they are ready. TO GET STARTED: Nurse as soon as possible after birth, and as often as the babies want to do so. This will stimulate your breasts to produce adequate amounts of milk. Mothers of twins almost always produce enough milk for both babies.  TIPS TO INCREASE SUCCESS:  Many mothers of multiples find it easiest to nurse the  babies together. However, if one of the infants is having difficulty latching or sucking, the mother may need to give that baby her full attention when it is time to feed.   Nursing two babies at the same time often gets easier as the babies get older and more experienced at latching onto the breast. Extra pillows under the mother's arms, legs, and under the babies can help this process.   Breastfeeding two babies at once may increase the mother's milk producing hormone (prolactin) levels, and boost her milk production. The more often the babies breastfeed effectively, the more milk the mother will produce.   Switch the babies from one side to the other at alternate feedings. For instance, if baby A feeds from the right breast and baby B feeds from the left breast, then at the next feeding, baby A should take the left breast and baby B the right breast. This ensures that both breasts get equal amounts of stimulation. It also uses the stronger sucking twin to increase the milk supply for the twin whose suck is weaker.   If one of the babies is having difficulty feeding, it may help to try breastfeeding him at the same time as his sibling. The baby with the stronger or more effective suck will stimulate the mother's milk to flow faster. This will encourage his twin to suck and swallow correctly.   It is important to avoid limiting the amount of time   each baby spends feeding at the breast. This allows both babies to obtain the fattier milk that is available at the end of the feeding, when the breast is emptier.   Avoiding bottles and pacifiers during the early weeks will encourage effective sucking patterns and help establish a good milk supply. You should not need supplements if you empty your breasts with each feeding.   A good latch for both infants is important in helping the babies empty the breast effectively, and for avoiding sore nipples. The most common cause of soreness is improper latch-on and  positioning.   In the early days, keep track of each infant's stools and wet diapers, to make sure each baby is getting enough milk. In the first 6 weeks, each baby should have 6 to 8 wet cloth diapers (5 to 6 disposable diapers) and 2 or more bowel movements per day.   There are several positions and holds that make it easier to nurse more than one baby at a time. Ask your nurse or lactation specialist to suggest tips on positioning.   If you know you are having twins, talk with a lactation consultant about more ways you can increase your success at breastfeeding.  Document Released: 06/18/2004 Document Revised: 10/31/2010 Document Reviewed: 01/05/2009 [employer] Patient Information 2012 [employer]. 

## 2011-03-14 NOTE — Progress Notes (Signed)
Edema- right foot.  Pain/pressure- pelvic.  Pulse- 74

## 2011-03-14 NOTE — Progress Notes (Signed)
U/S scheduled 04/11/11 at 930 am.

## 2011-03-26 ENCOUNTER — Encounter: Payer: Self-pay | Admitting: *Deleted

## 2011-04-11 ENCOUNTER — Ambulatory Visit (INDEPENDENT_AMBULATORY_CARE_PROVIDER_SITE_OTHER): Payer: Medicaid Other | Admitting: Family Medicine

## 2011-04-11 ENCOUNTER — Ambulatory Visit (HOSPITAL_COMMUNITY)
Admission: RE | Admit: 2011-04-11 | Discharge: 2011-04-11 | Disposition: A | Discharge status: Home / Self Care | Payer: Medicaid Other | Source: Ambulatory Visit | Attending: Physician Assistant | Admitting: Physician Assistant

## 2011-04-11 DIAGNOSIS — O30009 Twin pregnancy, unspecified number of placenta and unspecified number of amniotic sacs, unspecified trimester: Secondary | ICD-10-CM | POA: Insufficient documentation

## 2011-04-11 DIAGNOSIS — O358XX0 Maternal care for other (suspected) fetal abnormality and damage, not applicable or unspecified: Secondary | ICD-10-CM | POA: Insufficient documentation

## 2011-04-11 DIAGNOSIS — Z363 Encounter for antenatal screening for malformations: Secondary | ICD-10-CM | POA: Insufficient documentation

## 2011-04-11 DIAGNOSIS — Z1389 Encounter for screening for other disorder: Secondary | ICD-10-CM | POA: Insufficient documentation

## 2011-04-11 NOTE — Progress Notes (Signed)
Swelling in feet. Pressure in lower abdomen when standing a lot. Pulse 89.

## 2011-04-11 NOTE — Patient Instructions (Signed)
Pregnancy - Second Trimester The second trimester of pregnancy (3 to 6 months) is a period of rapid growth for you and your baby. At the end of the sixth month, your baby is about 9 inches long and weighs 1 1/2 pounds. You will begin to feel the baby move between 18 and 20 weeks of the pregnancy. This is called quickening. Weight gain is faster. A clear fluid (colostrum) may leak out of your breasts. You may feel small contractions of the womb (uterus). This is known as false labor or Braxton-Hicks contractions. This is like a practice for labor when the baby is ready to be born. Usually, the problems with morning sickness have usually passed by the end of your first trimester. Some women develop small dark blotches (called cholasma, mask of pregnancy) on their face that usually goes away after the baby is born. Exposure to the sun makes the blotches worse. Acne may also develop in some pregnant women and pregnant women who have acne, may find that it goes away. PRENATAL EXAMS  Blood work may continue to be done during prenatal exams. These tests are done to check on your health and the probable health of your baby. Blood work is used to follow your blood levels (hemoglobin). Anemia (low hemoglobin) is common during pregnancy. Iron and vitamins are given to help prevent this. You will also be checked for diabetes between 24 and 28 weeks of the pregnancy. Some of the previous blood tests may be repeated.   The size of the uterus is measured during each visit. This is to make sure that the baby is continuing to grow properly according to the dates of the pregnancy.   Your blood pressure is checked every prenatal visit. This is to make sure you are not getting toxemia.   Your urine is checked to make sure you do not have an infection, diabetes or protein in the urine.   Your weight is checked often to make sure gains are happening at the suggested rate. This is to ensure that both you and your baby are  growing normally.   Sometimes, an ultrasound is performed to confirm the proper growth and development of the baby. This is a test which bounces harmless sound waves off the baby so your caregiver can more accurately determine due dates.  Sometimes, a specialized test is done on the amniotic fluid surrounding the baby. This test is called an amniocentesis. The amniotic fluid is obtained by sticking a needle into the belly (abdomen). This is done to check the chromosomes in instances where there is a concern about possible genetic problems with the baby. It is also sometimes done near the end of pregnancy if an early delivery is required. In this case, it is done to help make sure the baby's lungs are mature enough for the baby to live outside of the womb. CHANGES OCCURING IN THE SECOND TRIMESTER OF PREGNANCY Your body goes through many changes during pregnancy. They vary from person to person. Talk to your caregiver about changes you notice that you are concerned about.  During the second trimester, you will likely have an increase in your appetite. It is normal to have cravings for certain foods. This varies from person to person and pregnancy to pregnancy.   Your lower abdomen will begin to bulge.   You may have to urinate more often because the uterus and baby are pressing on your bladder. It is also common to get more bladder infections during pregnancy (  pain with urination). You can help this by drinking lots of fluids and emptying your bladder before and after intercourse.   You may begin to get stretch marks on your hips, abdomen, and breasts. These are normal changes in the body during pregnancy. There are no exercises or medications to take that prevent this change.   You may begin to develop swollen and bulging veins (varicose veins) in your legs. Wearing support hose, elevating your feet for 15 minutes, 3 to 4 times a day and limiting salt in your diet helps lessen the problem.    Heartburn may develop as the uterus grows and pushes up against the stomach. Antacids recommended by your caregiver helps with this problem. Also, eating smaller meals 4 to 5 times a day helps.   Constipation can be treated with a stool softener or adding bulk to your diet. Drinking lots of fluids, vegetables, fruits, and whole grains are helpful.   Exercising is also helpful. If you have been very active up until your pregnancy, most of these activities can be continued during your pregnancy. If you have been less active, it is helpful to start an exercise program such as walking.   Hemorrhoids (varicose veins in the rectum) may develop at the end of the second trimester. Warm sitz baths and hemorrhoid cream recommended by your caregiver helps hemorrhoid problems.   Backaches may develop during this time of your pregnancy. Avoid heavy lifting, wear low heal shoes and practice good posture to help with backache problems.   Some pregnant women develop tingling and numbness of their hand and fingers because of swelling and tightening of ligaments in the wrist (carpel tunnel syndrome). This goes away after the baby is born.   As your breasts enlarge, you may have to get a bigger bra. Get a comfortable, cotton, support bra. Do not get a nursing bra until the last month of the pregnancy if you will be nursing the baby.   You may get a dark line from your belly button to the pubic area called the linea nigra.   You may develop rosy cheeks because of increase blood flow to the face.   You may develop spider looking lines of the face, neck, arms and chest. These go away after the baby is born.  HOME CARE INSTRUCTIONS   It is extremely important to avoid all smoking, herbs, alcohol, and unprescribed drugs during your pregnancy. These chemicals affect the formation and growth of the baby. Avoid these chemicals throughout the pregnancy to ensure the delivery of a healthy infant.   Most of your home  care instructions are the same as suggested for the first trimester of your pregnancy. Keep your caregiver's appointments. Follow your caregiver's instructions regarding medication use, exercise and diet.   During pregnancy, you are providing food for you and your baby. Continue to eat regular, well-balanced meals. Choose foods such as meat, fish, milk and other low fat dairy products, vegetables, fruits, and whole-grain breads and cereals. Your caregiver will tell you of the ideal weight gain.   A physical sexual relationship may be continued up until near the end of pregnancy if there are no other problems. Problems could include early (premature) leaking of amniotic fluid from the membranes, vaginal bleeding, abdominal pain, or other medical or pregnancy problems.   Exercise regularly if there are no restrictions. Check with your caregiver if you are unsure of the safety of some of your exercises. The greatest weight gain will occur in the   last 2 trimesters of pregnancy. Exercise will help you:   Control your weight.   Get you in shape for labor and delivery.   Lose weight after you have the baby.   Wear a good support or jogging bra for breast tenderness during pregnancy. This may help if worn during sleep. Pads or tissues may be used in the bra if you are leaking colostrum.   Do not use hot tubs, steam rooms or saunas throughout the pregnancy.   Wear your seat belt at all times when driving. This protects you and your baby if you are in an accident.   Avoid raw meat, uncooked cheese, cat litter boxes and soil used by cats. These carry germs that can cause birth defects in the baby.   The second trimester is also a good time to visit your dentist for your dental health if this has not been done yet. Getting your teeth cleaned is OK. Use a soft toothbrush. Brush gently during pregnancy.   It is easier to loose urine during pregnancy. Tightening up and strengthening the pelvic muscles will  help with this problem. Practice stopping your urination while you are going to the bathroom. These are the same muscles you need to strengthen. It is also the muscles you would use as if you were trying to stop from passing gas. You can practice tightening these muscles up 10 times a set and repeating this about 3 times per day. Once you know what muscles to tighten up, do not perform these exercises during urination. It is more likely to contribute to an infection by backing up the urine.   Ask for help if you have financial, counseling or nutritional needs during pregnancy. Your caregiver will be able to offer counseling for these needs as well as refer you for other special needs.   Your skin may become oily. If so, wash your face with mild soap, use non-greasy moisturizer and oil or cream based makeup.  MEDICATIONS AND DRUG USE IN PREGNANCY  Take prenatal vitamins as directed. The vitamin should contain 1 milligram of folic acid. Keep all vitamins out of reach of children. Only a couple vitamins or tablets containing iron may be fatal to a baby or young child when ingested.   Avoid use of all medications, including herbs, over-the-counter medications, not prescribed or suggested by your caregiver. Only take over-the-counter or prescription medicines for pain, discomfort, or fever as directed by your caregiver. Do not use aspirin.   Let your caregiver also know about herbs you may be using.   Alcohol is related to a number of birth defects. This includes fetal alcohol syndrome. All alcohol, in any form, should be avoided completely. Smoking will cause low birth rate and premature babies.   Street or illegal drugs are very harmful to the baby. They are absolutely forbidden. A baby born to an addicted mother will be addicted at birth. The baby will go through the same withdrawal an adult does.  SEEK MEDICAL CARE IF:  You have any concerns or worries during your pregnancy. It is better to call with  your questions if you feel they cannot wait, rather than worry about them. SEEK IMMEDIATE MEDICAL CARE IF:   An unexplained oral temperature above 102 F (38.9 C) develops, or as your caregiver suggests.   You have leaking of fluid from the vagina (birth canal). If leaking membranes are suspected, take your temperature and tell your caregiver of this when you call.   There   is vaginal spotting, bleeding, or passing clots. Tell your caregiver of the amount and how many pads are used. Light spotting in pregnancy is common, especially following intercourse.   You develop a bad smelling vaginal discharge with a change in the color from clear to white.   You continue to feel sick to your stomach (nauseated) and have no relief from remedies suggested. You vomit blood or coffee ground-like materials.   You lose more than 2 pounds of weight or gain more than 2 pounds of weight over 1 week, or as suggested by your caregiver.   You notice swelling of your face, hands, feet, or legs.   You get exposed to German measles and have never had them.   You are exposed to fifth disease or chickenpox.   You develop belly (abdominal) pain. Round ligament discomfort is a common non-cancerous (benign) cause of abdominal pain in pregnancy. Your caregiver still must evaluate you.   You develop a bad headache that does not go away.   You develop fever, diarrhea, pain with urination, or shortness of breath.   You develop visual problems, blurry, or double vision.   You fall or are in a car accident or any kind of trauma.   There is mental or physical violence at home.  Document Released: 02/12/2001 Document Revised: 10/31/2010 Document Reviewed: 08/17/2008 [employer] Patient Information 2012 [employer]. 

## 2011-04-11 NOTE — Progress Notes (Signed)
Patient without complaints.  Denies vaginal bleeding, abnormal vaginal discharge, contractions, loss of fluid.  Reports good fetal activity.  Has 20wk ultrasound later today.  Follow up in 4 weeks.

## 2011-04-12 LAB — POCT URINALYSIS DIP (DEVICE)
Glucose, UA: NEGATIVE mg/dL
Hgb urine dipstick: NEGATIVE
Nitrite: NEGATIVE
Protein, ur: NEGATIVE mg/dL
Specific Gravity, Urine: 1.02 (ref 1.005–1.030)
Urobilinogen, UA: 0.2 mg/dL (ref 0.0–1.0)
pH: 7 (ref 5.0–8.0)

## 2011-04-16 ENCOUNTER — Encounter: Payer: Self-pay | Admitting: Physician Assistant

## 2011-05-09 ENCOUNTER — Ambulatory Visit (INDEPENDENT_AMBULATORY_CARE_PROVIDER_SITE_OTHER): Payer: Medicaid Other | Admitting: Obstetrics & Gynecology

## 2011-05-09 ENCOUNTER — Encounter: Payer: Self-pay | Admitting: Obstetrics & Gynecology

## 2011-05-09 DIAGNOSIS — O30009 Twin pregnancy, unspecified number of placenta and unspecified number of amniotic sacs, unspecified trimester: Secondary | ICD-10-CM

## 2011-05-09 LAB — POCT URINALYSIS DIP (DEVICE)
Bilirubin Urine: NEGATIVE
Hgb urine dipstick: NEGATIVE
Leukocytes, UA: NEGATIVE
Nitrite: NEGATIVE
Protein, ur: NEGATIVE mg/dL
Urobilinogen, UA: 0.2 mg/dL (ref 0.0–1.0)
pH: 6.5 (ref 5.0–8.0)

## 2011-05-09 NOTE — Patient Instructions (Signed)
Levonorgestrel intrauterine device (IUD) What is this medicine? LEVONORGESTREL IUD (LEE voe nor jes trel) is a contraceptive (birth control) device. It is used to prevent pregnancy and to treat heavy bleeding that occurs during your period. It can be used for up to 5 years. This medicine may be used for other purposes; ask your health care provider or pharmacist if you have questions. What should I tell my health care provider before I take this medicine? They need to know if you have any of these conditions: -abnormal Pap smear -cancer of the breast, uterus, or cervix -diabetes -endometritis -genital or pelvic infection now or in the past -have more than one sexual partner or your partner has more than one partner -heart disease -history of an ectopic or tubal pregnancy -immune system problems -IUD in place -liver disease or tumor -problems with blood clots or take blood-thinners -use intravenous drugs -uterus of unusual shape -vaginal bleeding that has not been explained -an unusual or allergic reaction to levonorgestrel, other hormones, silicone, or polyethylene, medicines, foods, dyes, or preservatives -pregnant or trying to get pregnant -breast-feeding How should I use this medicine? This device is placed inside the uterus by a health care professional. Talk to your pediatrician regarding the use of this medicine in children. Special care may be needed. Overdosage: If you think you have taken too much of this medicine contact a poison control center or emergency room at once. NOTE: This medicine is only for you. Do not share this medicine with others. What if I miss a dose? This does not apply. What may interact with this medicine? Do not take this medicine with any of the following medications: -amprenavir -bosentan -fosamprenavir This medicine may also interact with the following medications: -aprepitant -barbiturate medicines for inducing sleep or treating  seizures -bexarotene -griseofulvin -medicines to treat seizures like carbamazepine, ethotoin, felbamate, oxcarbazepine, phenytoin, topiramate -modafinil -pioglitazone -rifabutin -rifampin -rifapentine -some medicines to treat HIV infection like atazanavir, indinavir, lopinavir, nelfinavir, tipranavir, ritonavir -St. John's wort -warfarin This list may not describe all possible interactions. Give your health care provider a list of all the medicines, herbs, non-prescription drugs, or dietary supplements you use. Also tell them if you smoke, drink alcohol, or use illegal drugs. Some items may interact with your medicine. What should I watch for while using this medicine? Visit your doctor or health care professional for regular check ups. See your doctor if you or your partner has sexual contact with others, becomes HIV positive, or gets a sexual transmitted disease. This product does not protect you against HIV infection (AIDS) or other sexually transmitted diseases. You can check the placement of the IUD yourself by reaching up to the top of your vagina with clean fingers to feel the threads. Do not pull on the threads. It is a good habit to check placement after each menstrual period. Call your doctor right away if you feel more of the IUD than just the threads or if you cannot feel the threads at all. The IUD may come out by itself. You may become pregnant if the device comes out. If you notice that the IUD has come out use a backup birth control method like condoms and call your health care provider. Using tampons will not change the position of the IUD and are okay to use during your period. What side effects may I notice from receiving this medicine? Side effects that you should report to your doctor or health care professional as soon as possible: -allergic reactions   like skin rash, itching or hives, swelling of the face, lips, or tongue -fever, flu-like symptoms -genital sores -high  blood pressure -no menstrual period for 6 weeks during use -pain, swelling, warmth in the leg -pelvic pain or tenderness -severe or sudden headache -signs of pregnancy -stomach cramping -sudden shortness of breath -trouble with balance, talking, or walking -unusual vaginal bleeding, discharge -yellowing of the eyes or skin Side effects that usually do not require medical attention (report to your doctor or health care professional if they continue or are bothersome): -acne -breast pain -change in sex drive or performance -changes in weight -cramping, dizziness, or faintness while the device is being inserted -headache -irregular menstrual bleeding within first 3 to 6 months of use -nausea This list may not describe all possible side effects. Call your doctor for medical advice about side effects. You may report side effects to FDA at 1-800-FDA-1088. Where should I keep my medicine? This does not apply. NOTE: This sheet is a summary. It may not cover all possible information. If you have questions about this medicine, talk to your doctor, pharmacist, or health care provider.  2012, [employer]. (03/11/2008 6:39:08 PM)Breastfeeding BENEFITS OF BREASTFEEDING For the baby  The first milk (colostrum) helps the baby's digestive system function better.   There are antibodies from the mother in the milk that help the baby fight off infections.   The baby has a lower incidence of asthma, allergies, and SIDS (sudden infant death syndrome).   The nutrients in breast milk are better than formulas for the baby and helps the baby's brain grow better.   Babies who breastfeed have less gas, colic, and constipation.  For the mother  Breastfeeding helps develop a very special bond between mother and baby.   It is more convenient, always available at the correct temperature and cheaper than formula feeding.   It burns calories in the mother and helps with losing weight that was  gained during pregnancy.   It makes the uterus contract back down to normal size faster and slows bleeding following delivery.   Breastfeeding mothers have a lower risk of developing breast cancer.  NURSE FREQUENTLY  A healthy, full-term baby may breastfeed as often as every hour or space his or her feedings to every 3 hours.   How often to nurse will vary from baby to baby. Watch your baby for signs of hunger, not the clock.   Nurse as often as the baby requests, or when you feel the need to reduce the fullness of your breasts.   Awaken the baby if it has been 3 to 4 hours since the last feeding.   Frequent feeding will help the mother make more milk and will prevent problems like sore nipples and engorgement of the breasts.  BABY'S POSITION AT THE BREAST  Whether lying down or sitting, be sure that the baby's tummy is facing your tummy.   Support the breast with 4 fingers underneath the breast and the thumb above. Make sure your fingers are well away from the nipple and baby's mouth.   Stroke the baby's lips and cheek closest to the breast gently with your finger or nipple.   When the baby's mouth is open wide enough, place all of your nipple and as much of the dark area around the nipple as possible into your baby's mouth.   Pull the baby in close so the tip of the nose and the baby's cheeks touch the breast during the feeding.  FEEDINGS    The length of each feeding varies from baby to baby and from feeding to feeding.   The baby must suck about 2 to 3 minutes for your milk to get to him or her. This is called a "let down." For this reason, allow the baby to feed on each breast as long as he or she wants. Your baby will end the feeding when he or she has received the right balance of nutrients.   To break the suction, put your finger into the corner of the baby's mouth and slide it between his or her gums before removing your breast from his or her mouth. This will help prevent  sore nipples.  REDUCING BREAST ENGORGEMENT  In the first week after your baby is born, you may experience signs of breast engorgement. When breasts are engorged, they feel heavy, warm, full, and may be tender to the touch. You can reduce engorgement if you:   Nurse frequently, every 2 to 3 hours. Mothers who breastfeed early and often have fewer problems with engorgement.   Place light ice packs on your breasts between feedings. This reduces swelling. Wrap the ice packs in a lightweight towel to protect your skin.   Apply moist hot packs to your breast for 5 to 10 minutes before each feeding. This increases circulation and helps the milk flow.   Gently massage your breast before and during the feeding.   Make sure that the baby empties at least one breast at every feeding before switching sides.   Use a breast pump to empty the breasts if your baby is sleepy or not nursing well. You may also want to pump if you are returning to work or or you feel you are getting engorged.   Avoid bottle feeds, pacifiers or supplemental feedings of water or juice in place of breastfeeding.   Be sure the baby is latched on and positioned properly while breastfeeding.   Prevent fatigue, stress, and anemia.   Wear a supportive bra, avoiding underwire styles.   Eat a balanced diet with enough fluids.  If you follow these suggestions, your engorgement should improve in 24 to 48 hours. If you are still experiencing difficulty, call your lactation consultant or caregiver. IS MY BABY GETTING ENOUGH MILK? Sometimes, mothers worry about whether their babies are getting enough milk. You can be assured that your baby is getting enough milk if:  The baby is actively sucking and you hear swallowing.   The baby nurses at least 8 to 12 times in a 24 hour time period. Nurse your baby until he or she unlatches or falls asleep at the first breast (at least 10 to 20 minutes), then offer the second side.   The baby is  wetting 5 to 6 disposable diapers (6 to 8 cloth diapers) in a 24 hour period by 5 to 6 days of age.   The baby is having at least 2 to 3 stools every 24 hours for the first few months. Breast milk is all the food your baby needs. It is not necessary for your baby to have water or formula. In fact, to help your breasts make more milk, it is best not to give your baby supplemental feedings during the early weeks.   The stool should be soft and yellow.   The baby should gain 4 to 7 ounces per week after he is 4 days old.  TAKE CARE OF YOURSELF Take care of your breasts by:  Bathing or showering daily.     Avoiding the use of soaps on your nipples.   Start feedings on your left breast at one feeding and on your right breast at the next feeding.   You will notice an increase in your milk supply 2 to 5 days after delivery. You may feel some discomfort from engorgement, which makes your breasts very firm and often tender. Engorgement "peaks" out within 24 to 48 hours. In the meantime, apply warm moist towels to your breasts for 5 to 10 minutes before feeding. Gentle massage and expression of some milk before feeding will soften your breasts, making it easier for your baby to latch on. Wear a well fitting nursing bra and air dry your nipples for 10 to 15 minutes after each feeding.   Only use cotton bra pads.   Only use pure lanolin on your nipples after nursing. You do not need to wash it off before nursing.  Take care of yourself by:   Eating well-balanced meals and nutritious snacks.   Drinking milk, fruit juice, and water to satisfy your thirst (about 8 glasses a day).   Getting plenty of rest.   Increasing calcium in your diet (1200 mg a day).   Avoiding foods that you notice affect the baby in a bad way.  SEEK MEDICAL CARE IF:   You have any questions or difficulty with breastfeeding.   You need help.   You have a hard, red, sore area on your breast, accompanied by a fever of 100.5  F (38.1 C) or more.   Your baby is too sleepy to eat well or is having trouble sleeping.   Your baby is wetting less than 6 diapers per day, by 22 days of age.   Your baby's skin or white part of his or her eyes is more yellow than it was in the hospital.   You feel depressed.  Document Released: 02/18/2005 Document Revised: 02/07/2011 Document Reviewed: 10/03/2008 [facility] Patient Information 2012 [place], [place].

## 2011-05-09 NOTE — Progress Notes (Signed)
Pulse- 101 Edema- ankles  Pain/pressure- lower abd, stomach BP Recheck 128/89

## 2011-05-09 NOTE — Progress Notes (Signed)
Informal [place] for FHT's   A = 146 vtx,  B = 140 breech   Fetal movement observed

## 2011-05-09 NOTE — Progress Notes (Signed)
Pt having feet swelling.  No evidence of DVT.  Pt to use compression stockings [facility] [employer]), thigh high.  No other complaints.  Manual BP rpt =  120/74 (twice) Ordered growth [place] for 24 weeks.

## 2011-05-09 NOTE — Progress Notes (Signed)
U/S scheduled May 16, 2011 at 915 am.

## 2011-05-16 ENCOUNTER — Inpatient Hospital Stay (HOSPITAL_COMMUNITY)
Admission: AD | Admit: 2011-05-16 | Discharge: 2011-05-25 | DRG: 765 | Disposition: A | Discharge status: Home / Self Care | Payer: Medicaid Other | Source: Ambulatory Visit | Attending: Obstetrics & Gynecology | Admitting: Obstetrics & Gynecology

## 2011-05-16 ENCOUNTER — Encounter (HOSPITAL_COMMUNITY): Payer: Self-pay | Admitting: *Deleted

## 2011-05-16 ENCOUNTER — Ambulatory Visit (HOSPITAL_COMMUNITY)
Admission: RE | Admit: 2011-05-16 | Discharge: 2011-05-16 | Disposition: A | Discharge status: Home / Self Care | Payer: Medicaid Other | Source: Ambulatory Visit | Attending: Obstetrics & Gynecology | Admitting: Obstetrics & Gynecology

## 2011-05-16 ENCOUNTER — Other Ambulatory Visit: Payer: Self-pay | Admitting: Obstetrics & Gynecology

## 2011-05-16 ENCOUNTER — Ambulatory Visit (HOSPITAL_COMMUNITY): Payer: Medicaid Other

## 2011-05-16 ENCOUNTER — Inpatient Hospital Stay (HOSPITAL_COMMUNITY): Payer: Medicaid Other

## 2011-05-16 DIAGNOSIS — O30009 Twin pregnancy, unspecified number of placenta and unspecified number of amniotic sacs, unspecified trimester: Secondary | ICD-10-CM | POA: Diagnosis present

## 2011-05-16 DIAGNOSIS — O3090 Multiple gestation, unspecified, unspecified trimester: Secondary | ICD-10-CM | POA: Diagnosis present

## 2011-05-16 DIAGNOSIS — Z3689 Encounter for other specified antenatal screening: Secondary | ICD-10-CM | POA: Insufficient documentation

## 2011-05-16 DIAGNOSIS — O26879 Cervical shortening, unspecified trimester: Principal | ICD-10-CM | POA: Diagnosis present

## 2011-05-16 DIAGNOSIS — Z01812 Encounter for preprocedural laboratory examination: Secondary | ICD-10-CM

## 2011-05-16 LAB — TYPE AND SCREEN: ABO/RH(D): A POS

## 2011-05-16 LAB — URINALYSIS, ROUTINE W REFLEX MICROSCOPIC
Glucose, UA: NEGATIVE mg/dL
Leukocytes, UA: NEGATIVE
Nitrite: NEGATIVE
Protein, ur: NEGATIVE mg/dL

## 2011-05-16 LAB — WET PREP, GENITAL
Clue Cells Wet Prep HPF POC: NONE SEEN
Trich, Wet Prep: NONE SEEN
Yeast Wet Prep HPF POC: NONE SEEN

## 2011-05-16 LAB — STREP B DNA PROBE: GBS: NEGATIVE

## 2011-05-16 LAB — BASIC METABOLIC PANEL
CO2: 24 mEq/L (ref 19–32)
Chloride: 108 mEq/L (ref 96–112)
Creatinine, Ser: 0.57 mg/dL (ref 0.50–1.10)
GFR calc Af Amer: >90 mL/min (ref >90)
Potassium: 3.8 mEq/L (ref 3.5–5.1)
Sodium: 138 mEq/L (ref 135–145)

## 2011-05-16 LAB — CBC
Hemoglobin: 11.3 g/dL — ABNORMAL LOW (ref 12.0–15.0)
MCH: 32.6 pg (ref 26.0–34.0)
MCV: 95.7 fL (ref 78.0–100.0)
RBC: 3.47 MIL/uL — ABNORMAL LOW (ref 3.87–5.11)
WBC: 9.3 10*3/uL (ref 4.0–10.5)

## 2011-05-16 MED ORDER — PROGESTERONE MICRONIZED 200 MG PO CAPS
200.0000 mg | ORAL_CAPSULE | Freq: Every day | ORAL | Status: DC
  Administered 2011-05-16 – 2011-05-21 (×6): 200 mg via VAGINAL
  Filled 2011-05-16 (×7): qty 1

## 2011-05-16 MED ORDER — LACTATED RINGERS IV SOLN
INTRAVENOUS | Status: DC
  Administered 2011-05-16 – 2011-05-19 (×7): via INTRAVENOUS

## 2011-05-16 MED ORDER — BETAMETHASONE SOD PHOS & ACET 6 (3-3) MG/ML IJ SUSP
12.0000 mg | INTRAMUSCULAR | Status: AC
  Administered 2011-05-16 – 2011-05-17 (×2): 12 mg via INTRAMUSCULAR
  Filled 2011-05-16 (×2): qty 2

## 2011-05-16 MED ORDER — DOCUSATE SODIUM 100 MG PO CAPS
100.0000 mg | ORAL_CAPSULE | Freq: Every day | ORAL | Status: DC
  Administered 2011-05-16 – 2011-05-22 (×7): 100 mg via ORAL
  Filled 2011-05-16 (×7): qty 1

## 2011-05-16 MED ORDER — PENICILLIN G POTASSIUM 5000000 UNITS IJ SOLR
2.5000 10*6.[IU] | INTRAMUSCULAR | Status: DC
  Administered 2011-05-16 – 2011-05-20 (×22): 2.5 10*6.[IU] via INTRAVENOUS
  Filled 2011-05-16 (×25): qty 2.5

## 2011-05-16 MED ORDER — FERROUS SULFATE 325 (65 FE) MG PO TABS
325.0000 mg | ORAL_TABLET | Freq: Two times a day (BID) | ORAL | Status: DC
  Administered 2011-05-16 – 2011-05-22 (×13): 325 mg via ORAL
  Filled 2011-05-16 (×13): qty 1

## 2011-05-16 MED ORDER — PRENATAL MULTIVITAMIN CH
1.0000 | ORAL_TABLET | Freq: Every day | ORAL | Status: DC
  Administered 2011-05-16 – 2011-05-22 (×7): 1 via ORAL
  Filled 2011-05-16 (×7): qty 1

## 2011-05-16 MED ORDER — CALCIUM CARBONATE ANTACID 500 MG PO CHEW: 2.0000 | CHEWABLE_TABLET | ORAL | Status: DC | PRN

## 2011-05-16 MED ORDER — MAGNESIUM SULFATE BOLUS VIA INFUSION
6.0000 g | Freq: Once | INTRAVENOUS | Status: AC
  Administered 2011-05-16: 6 g via INTRAVENOUS
  Filled 2011-05-16: qty 500

## 2011-05-16 MED ORDER — ACETAMINOPHEN 325 MG PO TABS
650.0000 mg | ORAL_TABLET | ORAL | Status: DC | PRN
  Administered 2011-05-20 (×2): 650 mg via ORAL
  Filled 2011-05-16 (×2): qty 2

## 2011-05-16 MED ORDER — ZOLPIDEM TARTRATE 10 MG PO TABS
10.0000 mg | ORAL_TABLET | Freq: Every evening | ORAL | Status: DC | PRN
  Administered 2011-05-16 – 2011-05-20 (×3): 10 mg via ORAL
  Filled 2011-05-16 (×3): qty 1

## 2011-05-16 MED ORDER — MAGNESIUM SULFATE 40 G IN LACTATED RINGERS - SIMPLE
2.0000 g/h | INTRAVENOUS | Status: DC
  Administered 2011-05-18 – 2011-05-19 (×2): 2 g/h via INTRAVENOUS
  Filled 2011-05-16 (×4): qty 500

## 2011-05-16 MED ORDER — PENICILLIN G POTASSIUM 5000000 UNITS IJ SOLR
5.0000 10*6.[IU] | Freq: Once | INTRAVENOUS | Status: DC
  Administered 2011-05-16: 5 10*6.[IU] via INTRAVENOUS
  Filled 2011-05-16: qty 5

## 2011-05-16 NOTE — Consult Note (Signed)
Asked to provide prenatal consultation for patient at risk for preterm delivery due to preterm cervical dilatation.  Mother is 26y.o. G1 with dichorionic twins (boy/girl) who is now 24.[redacted] weeks EGA, with otherwise previously uncomplicated pregnancy, routine [place] today showed cervical shortening, 2.5 dilated. Membranes intact, no UC's or bleeding.  She is being treated with betamethasone, Mag SO4 for tocolysis and neuroprotection, and was given PCN pending GBS results.  Discussed usual expectations for preterm infants at 24+ weeks gestation, including possible needs for DR resuscitation, respiratory support, IV access, and blood products.  Also presented risks of death or serious morbidity, including brain hemorrhage, lung and eye complications.  Projected possible length of stay in NICU until 36 - [redacted] wks EGA.  Discussed advantages of feeding with mother's milk, and encouraged her to consider pumping postnatally (she plans to pump and breast feed).  Patient was attentive, had appropriate questions, and was appreciative of my input.  FOB not present but I told her he could take NICU tour or call to speak with neo if he so desires.  Thank you for the consultation.

## 2011-05-16 NOTE — H&P (Signed)
Attending Antepartum Admission History and Physical  Alexis [name] is a 26 y.o. G1P0 with di/di twin gestation at [redacted]w[redacted]d admitted after no cervical length was noted on routine ultrasound today.  Patient denies any contractions, LOF, VB.  Reports good FM x 2.  Twins were noted to have good FHR, good concordance in weight, normal AFV x2, no other concerning signs or symptoms.  External os was noted to be dilated to about 2.5 cm, no measurable cervical length.  Fetal presentation is cephalic for Twin A and cephalic/oblique for Twin B.  Patient Active Problem List  Diagnoses  . Twin pregnancy  . Exposure to second hand smoke  . Supervision of high-risk pregnancy  . No Cervical Length on Ultrasound, Twin Gestation   Past Medical History: Past Medical History  Diagnosis Date  . Boil of buttock 2002    at the top between buttocks, abcess I&D  . No pertinent past medical history    Past Surgical History: Past Surgical History  Procedure Date  . Incise and drain abcess     in buttocks area   Obstetrical History: OB History    Grav Para Term Preterm Abortions TAB SAB Ect Mult Living   1              Gynecological History: Noncontributory. No history of cervical dysplasia or STDs.  Social History: History   Social History  . Marital Status: Single    Spouse Name: N/A    Number of Children: N/A  . Years of Education: N/A   Social History Main Topics  . Smoking status: Never Smoker   . Smokeless tobacco: Never Used  . Alcohol Use: Yes  . Drug Use: No  . Sexually Active: Yes   Other Topics Concern  . None   Social History Narrative  . None   Family History: Family History  Problem Relation Age of Onset  . Hypertension Maternal Grandmother   . Asthma Maternal Grandmother   . Cancer Maternal Grandmother     cancer  . Cancer Paternal Grandmother     lung   Allergies: No Known Allergies  Prescriptions prior to admission  Medication Sig Dispense Refill  . ferrous  sulfate (FERROUSUL) 325 (65 FE) MG tablet Take 1 tablet (325 mg total) by mouth 2 (two) times daily.  60 tablet  1  . Prenatal Vit-Fe Fumarate-FA (PRENATAL MULTIVITAMIN) TABS Take 1 tablet by mouth daily.       Review of Systems: Negative  Vitals:  Blood pressure 136/87, pulse 88, temperature 98 F (36.7 C), temperature source Oral, resp. rate 24, height 5\' 3"  (1.6 m), weight 93.441 kg (206 lb), last menstrual period 11/28/2010. Physical Examination: General appearance: alert, well appearing, and in no distress Chest: clear to auscultation, no wheezes, rales or rhonchi, symmetric air entry Heart: normal rate and regular rhythm Abdomen: gravid and fundal height  is consistent with twins Cervix: Evaluated by sterile speculum exam., Position: mid position, Dilation: 2.5cm, BBOW Thickness: paper thin and Consistency: not evaluated Station: Floating and fetal presentation is cephalic and cephalic/oblique on ultrasound. Extremities: extremities normal, atraumatic, no cyanosis or edema with DTRs 2+ bilaterally Membranes:intact Fetal Monitoring:Baseline is 140s for both; moderate variability x 2, no accels x 2, no decels x 2  Labs:  Recent Results (from the past 24 hour(s))  CBC   Collection Time   05/16/11 11:20 AM      Component Value Range   WBC 9.3  4.0 - 10.5 (K/uL)   RBC  3.47 (*) 3.87 - 5.11 (MIL/uL)   Hemoglobin 11.3 (*) 12.0 - 15.0 (g/dL)   HCT 40.9 (*) 81.1 - 46.0 (%)   MCV 95.7  78.0 - 100.0 (fL)   MCH 32.6  26.0 - 34.0 (pg)   MCHC 34.0  30.0 - 36.0 (g/dL)   RDW 91.4  78.2 - 95.6 (%)   Platelets 244  150 - 400 (K/uL)  BASIC METABOLIC PANEL   Collection Time   05/16/11 11:20 AM      Component Value Range   Sodium 138  135 - 145 (mEq/L)   Potassium 3.8  3.5 - 5.1 (mEq/L)   Chloride 108  96 - 112 (mEq/L)   CO2 24  19 - 32 (mEq/L)   Glucose, Bld 95  70 - 99 (mg/dL)   BUN 4 (*) 6 - 23 (mg/dL)   Creatinine, Ser 2.13  0.50 - 1.10 (mg/dL)   Calcium 9.1  8.4 - 08.6 (mg/dL)   GFR  calc non Af Amer >90  >90 (mL/min)   GFR calc Af Amer >90  >90 (mL/min)    Imaging Studies: 05/16/11 OB Ultrasound: Twin A EFW 694g/60%, normal AFV, cephalic, normal anatomy, female; and Twin B EFW 674g/56%, normal AFV, cephalic/oblique, normal anatomy, female; 0 cm cervical length, external os open to 2.5 cm  Ordered medications:    . betamethasone acetate-betamethasone sodium phosphate  12 mg Intramuscular Q24H  . docusate sodium  100 mg Oral Daily  . ferrous sulfate  325 mg Oral BID  . magnesium  6 g Intravenous Once  . pencillin G potassium IV  5 Million Units Intravenous Once   Followed by  . pencillin G potassium IV  2.5 Million Units Intravenous Q4H  . prenatal multivitamin  1 tablet Oral Daily  . progesterone  200 mg Vaginal QHS   I have reviewed the patient's current medications.  ASSESSMENT AND PLAN:  [redacted]w[redacted]d Patient Active Problem List  Diagnoses  . Twin pregnancy  . Exposure to second hand smoke  . Supervision of high-risk pregnancy  . No Cervical Length on Ultrasound, Twin Gestation  Admit to Antenatal Bedrest, Trendelenberg position, admission labs ordered BMZ regimen, magnesium sulfate ordered for tocolysis and neuroprotection Progesterone per vagina ordered to help with cervix PCN ordered for GBS unknown in setting of prematurity NICU and MFM consults ordered Routine antenatal care  Jaynie Collins, M.D. 05/16/2011 12:36 PM

## 2011-05-16 NOTE — Consult Note (Signed)
MATERNAL FETAL MEDICINE CONSULT  Patient Name: Alexis [name] Medical Record Number:  [id number] Date of Birth: 08-Nov-1985 Requesting Physician Name:  Tereso Newcomer, MD Date of Service: 05/16/2011  Chief Complaint Cervical Insufficiency  History of Present Illness Alexis [name] was seen today due cervical insufficiency at the request of Tereso Newcomer, MD.  The patient is a 26 y.o. G1P0,pregnant with dichorionic diamniotic twins at [redacted]w[redacted]d with an EDD of 09/04/2011, by Last Menstrual Period dating method.  She was seen earlier today for a routine ultrasound and was noted to have membranes protruding through the external os.  She denies pain, cramping, contractions, vaginal bleeding, or leakage of fluid.  Her fetuses are moving normally.  Review of Systems Pertinent items are noted in HPI.  Patient History OB History    Grav Para Term Preterm Abortions TAB SAB Ect Mult Living   1              # Outc Date GA Lbr Len/2nd Wgt Sex Del Anes PTL Lv   1 CUR               Past Medical History  Diagnosis Date  . Boil of buttock 2002    at the top between buttocks, abcess I&D  . No pertinent past medical history     Past Surgical History  Procedure Date  . Incise and drain abcess     in buttocks area    History   Social History  . Marital Status: Single    Spouse Name: N/A    Number of Children: N/A  . Years of Education: N/A   Social History Main Topics  . Smoking status: Never Smoker   . Smokeless tobacco: Never Used  . Alcohol Use: Yes  . Drug Use: No  . Sexually Active: Yes   Other Topics Concern  . None   Social History Narrative  . None    Family History  Problem Relation Age of Onset  . Hypertension Maternal Grandmother   . Asthma Maternal Grandmother   . Cancer Maternal Grandmother     cancer  . Cancer Paternal Grandmother     lung   In addition, the patient has no family history of multiple miscarriages, cervical insufficiency, mental  retardation, birth defects, or genetic diseases.  Physical Examination Filed Vitals:   05/16/11 1202  BP: 136/87  Pulse: 88  Temp: 98 F (36.7 C)  Resp: 24   General appearance - alert, well appearing, and in no distress Abdomen - soft, nontender, nondistended, no masses or organomegaly  Assessment and Recommendations 1.  Cervical Insufficiency.  As the patient is already receiving magnesium sulfate for neuroprotection and betamethasone to accelerate fetal maturity, I have few additional recommendations as there are no other interventions of proven benefit in this situation.  A cerclage is contra-indicated in this situation both due to her gestational age and twin pregnancy.  If rupture of membranes occurs and/or labor ensues a vaginal delivery can be attempted as the presenting fetus is vertex.  However, the second twin is not a candidate for a breech vaginal delivery due to its small size and high risk of head entrapment.  Thus, performing a primary cesarean if delivery is required would be a reasonable alternative to attempting a vaginal delivery.  Rema Fendt, MD

## 2011-05-17 ENCOUNTER — Encounter (HOSPITAL_COMMUNITY): Payer: Self-pay | Admitting: *Deleted

## 2011-05-17 NOTE — Progress Notes (Addendum)
FACULTY PRACTICE ANTEPARTUM(COMPREHENSIVE) NOTE  Alexis [name] is [name] 26 y.o. G1P0 with di/di twin gestation at [redacted]w[redacted]d  who is admitted after no cervical length on ultrasound done 05/16/11 and exam revealed BBOW. Fetal presentation is cephalic/oblique. Length of Stay:  1  Days  Subjective: No complaints. Patient reports good fetal movement x 2.  She reports no uterine contractions, no bleeding and no loss of fluid per vagina.  Vitals:  Blood pressure 125/60, pulse 101, temperature 98.5 F (36.9 C), temperature source Oral, resp. rate 20, height 5\' 3"  (1.6 m), weight 93.441 kg (206 lb), last menstrual period 11/28/2010. Physical Examination: General appearance - alert, well appearing, and in no distress Abdomen - soft, nontender, nondistended, no masses or organomegaly Fundal Height:  size equals dates Pelvic Exam:  Deferred Extremities: extremities normal, atraumatic, no cyanosis or edema, Homans sign is negative, no sign of DVT and SCD are in place with DTRs 2+ bilaterally Membranes:intact  Fetal Monitoring:  For both twins: Baseline: 130s bpm, Variability: moderate, Accelerations: Non-reactive but appropriate for gestational age and Decelerations: Absent  Labs:  Recent Results (from the past 24 hour(s))  CBC   Collection Time   05/16/11 11:20 AM      Component Value Range   WBC 9.3  4.0 - 10.5 (K/uL)   RBC 3.47 (*) 3.87 - 5.11 (MIL/uL)   Hemoglobin 11.3 (*) 12.0 - 15.0 (g/dL)   HCT 16.1 (*) 09.6 - 46.0 (%)   MCV 95.7  78.0 - 100.0 (fL)   MCH 32.6  26.0 - 34.0 (pg)   MCHC 34.0  30.0 - 36.0 (g/dL)   RDW 04.5  40.9 - 81.1 (%)   Platelets 244  150 - 400 (K/uL)  TYPE AND SCREEN   Collection Time   05/16/11 11:20 AM      Component Value Range   ABO/RH(D) [name] POS     Antibody Screen NEG     Sample Expiration 05/19/2011    BASIC METABOLIC PANEL   Collection Time   05/16/11 11:20 AM      Component Value Range   Sodium 138  135 - 145 (mEq/L)   Potassium 3.8  3.5 - 5.1 (mEq/L)   Chloride 108  96 - 112 (mEq/L)   CO2 24  19 - 32 (mEq/L)   Glucose, Bld 95  70 - 99 (mg/dL)   BUN 4 (*) 6 - 23 (mg/dL)   Creatinine, Ser 9.14  0.50 - 1.10 (mg/dL)   Calcium 9.1  8.4 - 78.2 (mg/dL)   GFR calc non Af Amer >90  >90 (mL/min)   GFR calc Af Amer >90  >90 (mL/min)  WET PREP, GENITAL   Collection Time   05/16/11 11:45 AM      Component Value Range   Yeast Wet Prep HPF POC NONE SEEN  NONE SEEN    Trich, Wet Prep NONE SEEN  NONE SEEN    Clue Cells Wet Prep HPF POC NONE SEEN  NONE SEEN    WBC, Wet Prep HPF POC FEW (*) NONE SEEN   URINALYSIS, ROUTINE W REFLEX MICROSCOPIC   Collection Time   05/16/11 12:20 PM      Component Value Range   Color, Urine YELLOW  YELLOW    APPearance CLOUDY (*) CLEAR    Specific Gravity, Urine 1.010  1.005 - 1.030    pH 7.0  5.0 - 8.0    Glucose, UA NEGATIVE  NEGATIVE (mg/dL)   Hgb urine dipstick NEGATIVE  NEGATIVE  Bilirubin Urine NEGATIVE  NEGATIVE    Ketones, ur NEGATIVE  NEGATIVE (mg/dL)   Protein, ur NEGATIVE  NEGATIVE (mg/dL)   Urobilinogen, UA 0.2  0.0 - 1.0 (mg/dL)   Nitrite NEGATIVE  NEGATIVE    Leukocytes, UA NEGATIVE  NEGATIVE     Imaging Studies:    05/16/11 OB Ultrasound: Twin [name] EFW 694g/60%, normal AFV, cephalic, normal anatomy, female; and Twin B EFW 674g/56%, normal AFV, cephalic/oblique, normal anatomy, female; 3% discordance;  0 cm cervical length, external os open to 2.5 cm  Medications:  Scheduled    . betamethasone acetate-betamethasone sodium phosphate  12 mg Intramuscular Q24H  . docusate sodium  100 mg Oral Daily  . ferrous sulfate  325 mg Oral BID  . magnesium  6 g Intravenous Once  . pencillin G potassium IV  5 Million Units Intravenous Once   Followed by  . pencillin G potassium IV  2.5 Million Units Intravenous Q4H  . prenatal multivitamin  1 tablet Oral Daily  . progesterone  200 mg Vaginal QHS   I have reviewed the patient's current medications.  ASSESSMENT: Patient Active Problem List  Diagnoses  .  Twin pregnancy  . Exposure to second hand smoke  . Supervision of high-risk pregnancy  . No Cervical Length on Ultrasound, Twin Gestation    PLAN: Continue bedrest, trendelenberg position Continue BMZ regimen, magnesium sulfate for tocolysis and neuroprotection  Continue progesterone per vagina to help with cervix  PCN ordered for GBS unknown in setting of prematurity; follow up culture results  NICU and MFM consults have been consulted; appreciate their input.  Cesarean delivery recommended by MFM for mode of delivery given increased risk of head entrapment for the second twin. Continue routine antenatal care.   Alexis [name] 05/17/2011,7:27 AM

## 2011-05-18 NOTE — Progress Notes (Signed)
Patient ID: Alexis [name], female   DOB: 09-Apr-1985, 26 y.o.   MRN: [id number] FACULTY PRACTICE ANTEPARTUM(COMPREHENSIVE) NOTE  Alexis [name] is a 26 y.o. G1P0 with di/di twin gestation at [redacted]w[redacted]d  who is admitted after no cervical length on ultrasound done 05/16/11 and exam revealed BBOW. Fetal presentation is cephalic/oblique. Length of Stay:  3  Days  Subjective: No complaints. Patient reports good fetal movement x 2.  She reports no uterine contractions, no bleeding and no loss of fluid per vagina.  Vitals:  Blood pressure 120/64, pulse 94, temperature 98.1 F (36.7 C), temperature source Oral, resp. rate 18, height 5\' 3"  (1.6 m), weight 206 lb (93.441 kg), last menstrual period 11/28/2010. Physical Examination: General appearance - alert, well appearing, and in no distress Abdomen - soft, nontender, nondistended, no masses or organomegaly Fundal Height:  size equals dates Pelvic Exam:  Deferred Extremities: extremities normal, atraumatic, no cyanosis or edema, Homans sign is negative, no sign of DVT and SCD are in place with DTRs 2+ bilaterally Membranes:intact  Fetal Monitoring:  For both twins: Baseline: 130s bpm, Variability: moderate, Accelerations: Non-reactive but appropriate for gestational age and Decelerations: Absent  Labs:  No results found for this or any previous visit (from the past 24 hour(s)).  Imaging Studies:    05/16/11 OB Ultrasound: Twin A EFW 694g/60%, normal AFV, cephalic, normal anatomy, female; and Twin B EFW 674g/56%, normal AFV, cephalic/oblique, normal anatomy, female; 3% discordance;  0 cm cervical length, external os open to 2.5 cm  Medications:  Scheduled    . betamethasone acetate-betamethasone sodium phosphate  12 mg Intramuscular Q24H  . docusate sodium  100 mg Oral Daily  . ferrous sulfate  325 mg Oral BID  . pencillin G potassium IV  2.5 Million Units Intravenous Q4H  . prenatal multivitamin  1 tablet Oral Daily  . progesterone  200 mg  Vaginal QHS   I have reviewed the patient's current medications.  ASSESSMENT: Patient Active Problem List  Diagnoses  . Twin pregnancy  . Exposure to second hand smoke  . Supervision of high-risk pregnancy  . No Cervical Length on Ultrasound, Twin Gestation    PLAN: Continue bedrest, trendelenberg position Finished BMZ regimen, magnesium sulfate for tocolysis and neuroprotection  Continue progesterone per vagina to help with cervix  PCN ordered for GBS unknown in setting of prematurity; follow up culture results  NICU and MFM consults have been consulted; appreciate their input.  Cesarean delivery recommended by MFM for mode of delivery given increased risk of head entrapment for the second twin. Continue routine antenatal care.   Shyler Hamill H 05/18/2011,8:03 AM

## 2011-05-19 LAB — TYPE AND SCREEN
ABO/RH(D): A POS
Antibody Screen: NEGATIVE

## 2011-05-19 LAB — CULTURE, BETA STREP (GROUP B ONLY)

## 2011-05-19 NOTE — Progress Notes (Signed)
Patient ID: BUELAH RENNIE, female   DOB: 23-Jun-1985, 26 y.o.   MRN: [id number] FACULTY PRACTICE ANTEPARTUM(COMPREHENSIVE) NOTE  Shan DALYA MASELLI is a 26 y.o. G1P0000 at [redacted]w[redacted]d by early ultrasound who is admitted for shortened cervix.   Fetal presentation is cephalic and oblique. Length of Stay:  3  Days  Subjective: Doing well.  Denies problems. Patient reports the fetal movement as active. Patient reports uterine contraction  activity as none. Patient reports  vaginal bleeding as none. Patient describes fluid per vagina as None.  Vitals:  Blood pressure 135/68, pulse 93, temperature 98.2 F (36.8 C), temperature source Oral, resp. rate 16, height 5\' 3"  (1.6 m), weight 93.441 kg (206 lb), last menstrual period 11/28/2010. Physical Examination:  General appearance - alert, well appearing, and in no distress Abdomen - soft, nontender, nondistended, no masses or organomegaly Fundal Height:  size equals dates Extremities: extremities normal, atraumatic, no cyanosis or edema  Membranes:intact  Fetal Monitoring:  Baseline: 140 bpm, No NST done secondary to gestational age.  Labs:  No results found for this or any previous visit (from the past 24 hour(s)).  Medications:  Scheduled    . docusate sodium  100 mg Oral Daily  . ferrous sulfate  325 mg Oral BID  . pencillin G potassium IV  2.5 Million Units Intravenous Q4H  . prenatal multivitamin  1 tablet Oral Daily  . progesterone  200 mg Vaginal QHS   I have reviewed the patient's current medications.  ASSESSMENT: Patient Active Problem List  Diagnoses  . Twin pregnancy  . Exposure to second hand smoke  . Supervision of high-risk pregnancy  . No Cervical Length on Ultrasound, Twin Gestation    PLAN: D/C Magnesium today.  Increase activity slowly.  Zanaya Baize S 05/19/2011,7:22 AM

## 2011-05-20 MED ORDER — SODIUM CHLORIDE 0.9 % IJ SOLN
3.0000 mL | Freq: Two times a day (BID) | INTRAMUSCULAR | Status: DC
  Administered 2011-05-20 – 2011-05-21 (×4): 3 mL via INTRAVENOUS

## 2011-05-20 MED ORDER — NIFEDIPINE ER 30 MG PO TB24
30.0000 mg | ORAL_TABLET | Freq: Two times a day (BID) | ORAL | Status: DC
  Administered 2011-05-20 – 2011-05-22 (×5): 30 mg via ORAL
  Filled 2011-05-20 (×7): qty 1

## 2011-05-20 NOTE — Progress Notes (Signed)
UR chart review completed.  

## 2011-05-20 NOTE — Progress Notes (Signed)
Patient ID: Alexis [name], female   DOB: 02-09-1986, 26 y.o.   MRN: [id number] Patient ID: Alexis [name], female   DOB: Jun 03, 1985, 26 y.o.   MRN: [id number] FACULTY PRACTICE ANTEPARTUM(COMPREHENSIVE) NOTE  Jared YVANA SAMONTE is a 26 y.o. G1P0000 at [redacted]w[redacted]d by early ultrasound who is admitted for shortened cervix.   Fetal presentation is cephalic and oblique. Length of Stay:  5  Days  Subjective: Doing well.  Denies problems. Patient reports the fetal movement as active. Patient reports uterine contraction  activity as none. Patient reports  vaginal bleeding as none. Patient describes fluid per vagina as None.  Vitals:  Blood pressure 139/59, pulse 82, temperature 98.2 F (36.8 C), temperature source Oral, resp. rate 18, height 5\' 3"  (1.6 m), weight 206 lb (93.441 kg), last menstrual period 11/28/2010. Physical Examination:  General appearance - alert, well appearing, and in no distress Abdomen - soft, nontender, nondistended, no masses or organomegaly Fundal Height:  size equals dates Extremities: extremities normal, atraumatic, no cyanosis or edema  Membranes:intact  Fetal Monitoring:  Baseline: 140 bpm, No NST done secondary to gestational age.  Labs:  Recent Results (from the past 24 hour(s))  TYPE AND SCREEN   Collection Time   05/19/11 10:27 AM      Component Value Range   ABO/RH(D) A POS     Antibody Screen NEG     Sample Expiration 05/22/2011      Medications:  Scheduled    . docusate sodium  100 mg Oral Daily  . ferrous sulfate  325 mg Oral BID  . NIFEdipine  30 mg Oral BID  . prenatal multivitamin  1 tablet Oral Daily  . progesterone  200 mg Vaginal QHS  . DISCONTD: pencillin G potassium IV  2.5 Million Units Intravenous Q4H   I have reviewed the patient's current medications.  ASSESSMENT: Patient Active Problem List  Diagnoses  . Twin pregnancy  . Exposure to second hand smoke  . Supervision of high-risk pregnancy  . No Cervical Length on Ultrasound,  Twin Gestation    PLAN: 24 5/[redacted] weeks gestation, twins, cephalic oblique  D/C Magnesium today.  Increase activity slowly. Patient on Prometrium per vagina and Procardia as well. Patient aware she will be until delivery.  Brandyce Dimario H 05/20/2011,7:46 AM

## 2011-05-21 DIAGNOSIS — O329XX0 Maternal care for malpresentation of fetus, unspecified, not applicable or unspecified: Secondary | ICD-10-CM

## 2011-05-21 DIAGNOSIS — O3090 Multiple gestation, unspecified, unspecified trimester: Secondary | ICD-10-CM

## 2011-05-21 DIAGNOSIS — O30009 Twin pregnancy, unspecified number of placenta and unspecified number of amniotic sacs, unspecified trimester: Secondary | ICD-10-CM

## 2011-05-21 DIAGNOSIS — O26879 Cervical shortening, unspecified trimester: Secondary | ICD-10-CM

## 2011-05-21 NOTE — Progress Notes (Signed)
Patient ID: Alexis [name], female   DOB: [birth year], 26 y.o.   MRN: [id number] FACULTY PRACTICE ANTEPARTUM(COMPREHENSIVE) NOTE  Alexis [name] is a 26 y.o. G1P0000 at [redacted]w[redacted]d by LMP who is admitted for short cervix and cervical dilation .   Fetal presentation is cephalic and oblique. Length of Stay:  5  Days  Subjective:  Patient reports the fetal movement as active. Patient reports uterine contraction  activity as rare. Patient reports  vaginal bleeding as none. Patient describes fluid per vagina as None.  Vitals:  Blood pressure 110/60, pulse 99, temperature 98.5 F (36.9 C), temperature source Oral, resp. rate 20, height 5\' 3"  (1.6 m), weight 206 lb (93.441 kg), last menstrual period 11/28/2010. Physical Examination:  General appearance - alert, well appearing, and in no distress Heart - normal rate and regular rhythm Abdomen - soft, nontender, nondistended Fundal Height:  consistent with twins Cervical Exam: Not evaluated. and found to be not evaluated/ / and fetal presentation is cephalic. Extremities: extremities normal, atraumatic, no cyanosis or edema and Homans sign is negative, no sign of DVT with DTRs 2+ bilaterally Membranes:intact  Fetal Monitoring:  Baseline: 140-150 bpm  Labs:  No results found for this or any previous visit (from the past 24 hour(s)).  Medications:  Scheduled    . docusate sodium  100 mg Oral Daily  . ferrous sulfate  325 mg Oral BID  . NIFEdipine  30 mg Oral BID  . prenatal multivitamin  1 tablet Oral Daily  . progesterone  200 mg Vaginal QHS  . sodium chloride  3 mL Intravenous Q12H   I have reviewed the patient's current medications.  ASSESSMENT: Patient Active Problem List  Diagnoses  . Twin pregnancy  . Exposure to second hand smoke  . Supervision of high-risk pregnancy  . No Cervical Length on Ultrasound, Twin Gestation    PLAN: Continue present management, hospitalization  Yarixa Lightcap 05/21/2011,6:43 AM

## 2011-05-22 ENCOUNTER — Encounter (HOSPITAL_COMMUNITY): Payer: Self-pay | Admitting: *Deleted

## 2011-05-22 ENCOUNTER — Encounter (HOSPITAL_COMMUNITY)
Admission: AD | Disposition: A | Discharge status: Home / Self Care | Payer: Self-pay | Source: Ambulatory Visit | Attending: Obstetrics & Gynecology

## 2011-05-22 ENCOUNTER — Encounter (HOSPITAL_COMMUNITY): Payer: Self-pay | Admitting: Anesthesiology

## 2011-05-22 ENCOUNTER — Inpatient Hospital Stay (HOSPITAL_COMMUNITY): Payer: Medicaid Other | Admitting: Anesthesiology

## 2011-05-22 DIAGNOSIS — O329XX0 Maternal care for malpresentation of fetus, unspecified, not applicable or unspecified: Secondary | ICD-10-CM

## 2011-05-22 DIAGNOSIS — O3090 Multiple gestation, unspecified, unspecified trimester: Secondary | ICD-10-CM

## 2011-05-22 DIAGNOSIS — O30009 Twin pregnancy, unspecified number of placenta and unspecified number of amniotic sacs, unspecified trimester: Secondary | ICD-10-CM

## 2011-05-22 DIAGNOSIS — O26879 Cervical shortening, unspecified trimester: Secondary | ICD-10-CM

## 2011-05-22 LAB — TYPE AND SCREEN: ABO/RH(D): A POS

## 2011-05-22 SURGERY — Surgical Case
Anesthesia: Spinal | Site: Abdomen | Wound class: Clean Contaminated

## 2011-05-22 MED ORDER — OXYTOCIN 10 UNIT/ML IJ SOLN
INTRAMUSCULAR | Status: AC
  Filled 2011-05-22: qty 2

## 2011-05-22 MED ORDER — SODIUM CHLORIDE 0.9 % IJ SOLN: 3.0000 mL | INTRAMUSCULAR | Status: DC | PRN

## 2011-05-22 MED ORDER — KETOROLAC TROMETHAMINE 60 MG/2ML IM SOLN
INTRAMUSCULAR | Status: AC
  Administered 2011-05-22: 60 mg via INTRAMUSCULAR
  Filled 2011-05-22: qty 2

## 2011-05-22 MED ORDER — DIPHENHYDRAMINE HCL 25 MG PO CAPS: 25.0000 mg | ORAL_CAPSULE | ORAL | Status: DC | PRN

## 2011-05-22 MED ORDER — OXYTOCIN 10 UNIT/ML IJ SOLN
INTRAMUSCULAR | Status: DC | PRN
  Administered 2011-05-22 (×2): 20 [IU] via INTRAMUSCULAR

## 2011-05-22 MED ORDER — BUPIVACAINE IN DEXTROSE 0.75-8.25 % IT SOLN
INTRATHECAL | Status: DC | PRN
  Administered 2011-05-22: 1.4 mL via INTRATHECAL

## 2011-05-22 MED ORDER — NALBUPHINE HCL 10 MG/ML IJ SOLN
5.0000 mg | INTRAMUSCULAR | Status: DC | PRN
  Filled 2011-05-22: qty 1

## 2011-05-22 MED ORDER — CITRIC ACID-SODIUM CITRATE 334-500 MG/5ML PO SOLN
ORAL | Status: AC
  Administered 2011-05-22: 30 mL
  Filled 2011-05-22: qty 15

## 2011-05-22 MED ORDER — SCOPOLAMINE 1 MG/3DAYS TD PT72
MEDICATED_PATCH | TRANSDERMAL | Status: AC
  Administered 2011-05-22: 1.5 mg via TRANSDERMAL
  Filled 2011-05-22: qty 1

## 2011-05-22 MED ORDER — PROMETHAZINE HCL 25 MG/ML IJ SOLN
6.2500 mg | INTRAMUSCULAR | Status: DC | PRN
  Administered 2011-05-22: 6.25 mg via INTRAVENOUS

## 2011-05-22 MED ORDER — ONDANSETRON HCL 4 MG/2ML IJ SOLN
INTRAMUSCULAR | Status: DC | PRN
  Administered 2011-05-22: 4 mg via INTRAVENOUS

## 2011-05-22 MED ORDER — DIBUCAINE 1 % RE OINT: 1.0000 "application " | TOPICAL_OINTMENT | RECTAL | Status: DC | PRN

## 2011-05-22 MED ORDER — FENTANYL CITRATE 0.05 MG/ML IJ SOLN
INTRAMUSCULAR | Status: DC | PRN
  Administered 2011-05-22: 25 ug via INTRATHECAL

## 2011-05-22 MED ORDER — TERBUTALINE SULFATE 1 MG/ML IJ SOLN
INTRAMUSCULAR | Status: AC
  Administered 2011-05-22: 0.25 mg via SUBCUTANEOUS
  Filled 2011-05-22: qty 1

## 2011-05-22 MED ORDER — KETOROLAC TROMETHAMINE 30 MG/ML IJ SOLN: 30.0000 mg | Freq: Four times a day (QID) | INTRAMUSCULAR | Status: AC | PRN

## 2011-05-22 MED ORDER — OXYTOCIN 20 UNITS IN LACTATED RINGERS INFUSION - SIMPLE
125.0000 mL/h | INTRAVENOUS | Status: AC
  Filled 2011-05-22: qty 1000

## 2011-05-22 MED ORDER — SENNOSIDES-DOCUSATE SODIUM 8.6-50 MG PO TABS
2.0000 | ORAL_TABLET | Freq: Every day | ORAL | Status: DC
  Administered 2011-05-22 – 2011-05-24 (×3): 2 via ORAL

## 2011-05-22 MED ORDER — ONDANSETRON HCL 4 MG/2ML IJ SOLN: 4.0000 mg | INTRAMUSCULAR | Status: DC | PRN

## 2011-05-22 MED ORDER — ONDANSETRON HCL 4 MG/2ML IJ SOLN
INTRAMUSCULAR | Status: AC
  Filled 2011-05-22: qty 2

## 2011-05-22 MED ORDER — LANOLIN HYDROUS EX OINT: 1.0000 "application " | TOPICAL_OINTMENT | CUTANEOUS | Status: DC | PRN

## 2011-05-22 MED ORDER — PROMETHAZINE HCL 25 MG/ML IJ SOLN
INTRAMUSCULAR | Status: AC
  Administered 2011-05-22: 6.25 mg via INTRAVENOUS
  Filled 2011-05-22: qty 1

## 2011-05-22 MED ORDER — LIDOCAINE-EPINEPHRINE (PF) 2 %-1:200000 IJ SOLN
INTRAMUSCULAR | Status: AC
  Filled 2011-05-22: qty 20

## 2011-05-22 MED ORDER — ONDANSETRON HCL 4 MG/2ML IJ SOLN: 4.0000 mg | Freq: Three times a day (TID) | INTRAMUSCULAR | Status: DC | PRN

## 2011-05-22 MED ORDER — DIPHENHYDRAMINE HCL 50 MG/ML IJ SOLN: 12.5000 mg | INTRAMUSCULAR | Status: DC | PRN

## 2011-05-22 MED ORDER — SIMETHICONE 80 MG PO CHEW
80.0000 mg | CHEWABLE_TABLET | Freq: Three times a day (TID) | ORAL | Status: DC
  Administered 2011-05-22 – 2011-05-25 (×10): 80 mg via ORAL

## 2011-05-22 MED ORDER — WITCH HAZEL-GLYCERIN EX PADS: 1.0000 "application " | MEDICATED_PAD | CUTANEOUS | Status: DC | PRN

## 2011-05-22 MED ORDER — HYDROMORPHONE HCL PF 1 MG/ML IJ SOLN
0.2500 mg | INTRAMUSCULAR | Status: DC | PRN
  Administered 2011-05-22: 0.5 mg via INTRAVENOUS

## 2011-05-22 MED ORDER — SODIUM BICARBONATE 8.4 % IV SOLN
INTRAVENOUS | Status: AC
  Filled 2011-05-22: qty 50

## 2011-05-22 MED ORDER — PHENYLEPHRINE HCL 10 MG/ML IJ SOLN
INTRAMUSCULAR | Status: DC | PRN
  Administered 2011-05-22 (×5): 80 ug via INTRAVENOUS

## 2011-05-22 MED ORDER — IBUPROFEN 600 MG PO TABS
600.0000 mg | ORAL_TABLET | Freq: Four times a day (QID) | ORAL | Status: DC
  Administered 2011-05-22 – 2011-05-25 (×10): 600 mg via ORAL
  Filled 2011-05-22 (×4): qty 1

## 2011-05-22 MED ORDER — MENTHOL 3 MG MT LOZG: 1.0000 | LOZENGE | OROMUCOSAL | Status: DC | PRN

## 2011-05-22 MED ORDER — FENTANYL CITRATE 0.05 MG/ML IJ SOLN
INTRAMUSCULAR | Status: AC
  Filled 2011-05-22: qty 2

## 2011-05-22 MED ORDER — ONDANSETRON HCL 4 MG PO TABS: 4.0000 mg | ORAL_TABLET | ORAL | Status: DC | PRN

## 2011-05-22 MED ORDER — EPHEDRINE SULFATE 50 MG/ML IJ SOLN
INTRAMUSCULAR | Status: DC | PRN
  Administered 2011-05-22: 10 mg via INTRAVENOUS

## 2011-05-22 MED ORDER — LACTATED RINGERS IV SOLN
INTRAVENOUS | Status: DC
  Administered 2011-05-22 – 2011-05-23 (×2): via INTRAVENOUS

## 2011-05-22 MED ORDER — SCOPOLAMINE 1 MG/3DAYS TD PT72
1.0000 | MEDICATED_PATCH | Freq: Once | TRANSDERMAL | Status: AC
  Administered 2011-05-22: 1.5 mg via TRANSDERMAL

## 2011-05-22 MED ORDER — LACTATED RINGERS IV SOLN
INTRAVENOUS | Status: DC | PRN
  Administered 2011-05-22 (×3): via INTRAVENOUS

## 2011-05-22 MED ORDER — ZOLPIDEM TARTRATE 5 MG PO TABS: 5.0000 mg | ORAL_TABLET | Freq: Every evening | ORAL | Status: DC | PRN

## 2011-05-22 MED ORDER — NALOXONE HCL 0.4 MG/ML IJ SOLN: 0.4000 mg | INTRAMUSCULAR | Status: DC | PRN

## 2011-05-22 MED ORDER — HYDROMORPHONE HCL PF 1 MG/ML IJ SOLN
INTRAMUSCULAR | Status: AC
  Administered 2011-05-22: 0.5 mg via INTRAVENOUS
  Filled 2011-05-22: qty 1

## 2011-05-22 MED ORDER — IBUPROFEN 600 MG PO TABS
600.0000 mg | ORAL_TABLET | Freq: Four times a day (QID) | ORAL | Status: DC | PRN
  Filled 2011-05-22 (×6): qty 1

## 2011-05-22 MED ORDER — KETOROLAC TROMETHAMINE 30 MG/ML IJ SOLN: 15.0000 mg | Freq: Once | INTRAMUSCULAR | Status: DC | PRN

## 2011-05-22 MED ORDER — DIPHENHYDRAMINE HCL 50 MG/ML IJ SOLN: 25.0000 mg | INTRAMUSCULAR | Status: DC | PRN

## 2011-05-22 MED ORDER — PRENATAL MULTIVITAMIN CH
1.0000 | ORAL_TABLET | Freq: Every day | ORAL | Status: DC
  Administered 2011-05-23 – 2011-05-25 (×3): 1 via ORAL
  Filled 2011-05-22 (×3): qty 1

## 2011-05-22 MED ORDER — SODIUM CHLORIDE 0.9 % IV SOLN
1.0000 ug/kg/h | INTRAVENOUS | Status: DC | PRN
  Filled 2011-05-22: qty 2.5

## 2011-05-22 MED ORDER — MEPERIDINE HCL 25 MG/ML IJ SOLN: 6.2500 mg | INTRAMUSCULAR | Status: DC | PRN

## 2011-05-22 MED ORDER — TETANUS-DIPHTH-ACELL PERTUSSIS 5-2.5-18.5 LF-MCG/0.5 IM SUSP
0.5000 mL | Freq: Once | INTRAMUSCULAR | Status: AC
  Administered 2011-05-24: 0.5 mL via INTRAMUSCULAR
  Filled 2011-05-22 (×2): qty 0.5

## 2011-05-22 MED ORDER — OXYCODONE-ACETAMINOPHEN 5-325 MG PO TABS
1.0000 | ORAL_TABLET | ORAL | Status: DC | PRN
  Administered 2011-05-23 – 2011-05-24 (×2): 1 via ORAL
  Filled 2011-05-22: qty 1
  Filled 2011-05-22: qty 2

## 2011-05-22 MED ORDER — MORPHINE SULFATE (PF) 0.5 MG/ML IJ SOLN
INTRAMUSCULAR | Status: DC | PRN
  Administered 2011-05-22: .2 mg via INTRATHECAL

## 2011-05-22 MED ORDER — MORPHINE SULFATE 0.5 MG/ML IJ SOLN
INTRAMUSCULAR | Status: AC
  Filled 2011-05-22: qty 10

## 2011-05-22 MED ORDER — PHENYLEPHRINE 40 MCG/ML (10ML) SYRINGE FOR IV PUSH (FOR BLOOD PRESSURE SUPPORT)
PREFILLED_SYRINGE | INTRAVENOUS | Status: AC
  Filled 2011-05-22: qty 10

## 2011-05-22 MED ORDER — SIMETHICONE 80 MG PO CHEW: 80.0000 mg | CHEWABLE_TABLET | ORAL | Status: DC | PRN

## 2011-05-22 MED ORDER — KETOROLAC TROMETHAMINE 60 MG/2ML IM SOLN
60.0000 mg | Freq: Once | INTRAMUSCULAR | Status: AC | PRN
  Administered 2011-05-22: 60 mg via INTRAMUSCULAR

## 2011-05-22 MED ORDER — DIPHENHYDRAMINE HCL 25 MG PO CAPS: 25.0000 mg | ORAL_CAPSULE | Freq: Four times a day (QID) | ORAL | Status: DC | PRN

## 2011-05-22 MED ORDER — CEFAZOLIN SODIUM 1-5 GM-% IV SOLN
INTRAVENOUS | Status: DC | PRN
  Administered 2011-05-22: 2 g via INTRAVENOUS

## 2011-05-22 MED ORDER — EPHEDRINE 5 MG/ML INJ
INTRAVENOUS | Status: AC
  Filled 2011-05-22: qty 10

## 2011-05-22 SURGICAL SUPPLY — 35 items
BENZOIN TINCTURE PRP APPL 2/3 (GAUZE/BANDAGES/DRESSINGS) ×2 IMPLANT
CHLORAPREP W/TINT 26ML (MISCELLANEOUS) ×4 IMPLANT
CLAMP CORD UMBIL (MISCELLANEOUS) ×6 IMPLANT
CONTAINER PREFILL 10% NBF 15ML (MISCELLANEOUS) IMPLANT
DRAPE PROXIMA HALF (DRAPES) ×6 IMPLANT
DRESSING TELFA 8X3 (GAUZE/BANDAGES/DRESSINGS) IMPLANT
ELECT REM PT RETURN 9FT ADLT (ELECTROSURGICAL) ×2
ELECTRODE REM PT RTRN 9FT ADLT (ELECTROSURGICAL) ×1 IMPLANT
EXTRACTOR VACUUM M CUP 4 TUBE (SUCTIONS) IMPLANT
GAUZE SPONGE 4X4 12PLY STRL LF (GAUZE/BANDAGES/DRESSINGS) IMPLANT
GLOVE BIOGEL M STRL SZ7.5 (GLOVE) ×8 IMPLANT
GLOVE BIOGEL PI IND STRL 6.5 (GLOVE) ×4 IMPLANT
GLOVE BIOGEL PI INDICATOR 6.5 (GLOVE) ×4
GLOVE ECLIPSE 6.5 STRL STRAW (GLOVE) ×6 IMPLANT
GLOVE SURG SS PI 6.0 STRL IVOR (GLOVE) ×2 IMPLANT
GOWN PREVENTION PLUS LG XLONG (DISPOSABLE) ×14 IMPLANT
KIT ABG SYR 3ML LUER SLIP (SYRINGE) ×4 IMPLANT
LEGGING LITHOTOMY PAIR STRL (DRAPES) ×2 IMPLANT
NEEDLE HYPO 25X5/8 SAFETYGLIDE (NEEDLE) IMPLANT
NS IRRIG 1000ML POUR BTL (IV SOLUTION) ×2 IMPLANT
PACK C SECTION WH (CUSTOM PROCEDURE TRAY) ×2 IMPLANT
PAD ABD 7.5X8 STRL (GAUZE/BANDAGES/DRESSINGS) IMPLANT
RTRCTR C-SECT PINK 25CM LRG (MISCELLANEOUS) IMPLANT
SEPRAFILM MEMBRANE 5X6 (MISCELLANEOUS) IMPLANT
SLEEVE SCD COMPRESS KNEE MED (MISCELLANEOUS) ×2 IMPLANT
STAPLER VISISTAT 35W (STAPLE) IMPLANT
STRIP CLOSURE SKIN 1/2X4 (GAUZE/BANDAGES/DRESSINGS) ×2 IMPLANT
SUT PLAIN 0 NONE (SUTURE) IMPLANT
SUT VIC AB 0 CT1 36 (SUTURE) ×8 IMPLANT
SUT VIC AB 4-0 KS 27 (SUTURE) ×2 IMPLANT
SYRINGE 10CC LL (SYRINGE) ×4 IMPLANT
TOWEL OR 17X24 6PK STRL BLUE (TOWEL DISPOSABLE) IMPLANT
TRAY FOLEY CATH 14FR (SET/KITS/TRAYS/PACK) ×2 IMPLANT
TUBING CONNECTOR 18X5MM (MISCELLANEOUS) ×2 IMPLANT
WATER STERILE IRR 1000ML POUR (IV SOLUTION) IMPLANT

## 2011-05-22 NOTE — Anesthesia Procedure Notes (Signed)
Spinal  Patient location during procedure: OR Start time: 05/22/2011 12:13 PM End time: 05/22/2011 12:17 PM Staffing Anesthesiologist: Sandrea Hughs Performed by: anesthesiologist  Preanesthetic Checklist Completed: patient identified, site marked, surgical consent, pre-op evaluation, timeout performed, IV checked, risks and benefits discussed and monitors and equipment checked Spinal Block Patient position: sitting Prep: DuraPrep Patient monitoring: heart rate, cardiac monitor, continuous pulse ox and blood pressure Approach: midline Location: L3-4 Injection technique: single-shot Needle Needle type: Sprotte  Needle gauge: 24 G Needle length: 9 cm Needle insertion depth: 8 cm Assessment Sensory level: T4

## 2011-05-22 NOTE — Progress Notes (Signed)
Patient ID: Alexis [name], female   DOB: [birth year], 26 y.o.   MRN: [id number]   S. Denies problems. Denies VB, ROM, or CTXs. Reports good FM. Having BMs.  O. VSS, AF      FHR- reassuring x 2, no contractions on toco      Abd- benign  A/P.  Twins at [redacted] weeks EGA with very short cervix- continue current management.

## 2011-05-22 NOTE — Op Note (Signed)
Alexis [name] PROCEDURE DATE: 05/16/2011 - 05/22/2011  PREOPERATIVE DIAGNOSIS: Intrauterine pregnancy at  [redacted]w[redacted]d weeks gestation; breech presentation  POSTOPERATIVE DIAGNOSIS: The same  PROCEDURE: Primary Low Transverse Cesarean Section  SURGEON:  Dr. Catalina Antigua  ASSISTANT: Dr Candelaria Celeste  INDICATIONS: Alexis [name] is a 26 y.o. G1P0000 at [redacted]w[redacted]d scheduled for cesarean section secondary to breech presentation.  The risks of cesarean section discussed with the patient included but were not limited to: bleeding which may require transfusion or reoperation; infection which may require antibiotics; injury to bowel, bladder, ureters or other surrounding organs; injury to the fetus; need for additional procedures including hysterectomy in the event of a life-threatening hemorrhage; placental abnormalities wth subsequent pregnancies, incisional problems, thromboembolic phenomenon and other postoperative/anesthesia complications. The patient concurred with the proposed plan, giving informed written consent for the procedure.    FINDINGS:  Viable female infant in breech presentation.  Apgars 5 and 9, weight, 1 pounds and 6 ounces.  Clear amniotic fluid.  Intact placenta, three vessel cord.  Normal uterus, fallopian tubes and ovaries bilaterally.  ANESTHESIA:    Spinal INTRAVENOUS FLUIDS: 2000 ml ESTIMATED BLOOD LOSS: 400 ml URINE OUTPUT:  200 ml SPECIMENS: Placenta sent to pathology COMPLICATIONS: None immediate  PROCEDURE IN DETAIL:  The patient received intravenous antibiotics and had sequential compression devices applied to her lower extremities while in the preoperative area.  She was then taken to the operating room where spinal anesthesia was administered and was found to be adequate. She was then placed in a dorsal supine position with a leftward tilt, and prepped and draped in a sterile manner.  A foley catheter was placed into her bladder and attached to constant gravity, which  drained clear fluid throughout.  After an adequate timeout was performed, a Pfannenstiel skin incision was made with scalpel and carried through to the underlying layer of fascia. The fascia was incised in the midline and this incision was extended bilaterally using the Mayo scissors. Kocher clamps were applied to the superior aspect of the fascial incision and the underlying rectus muscles were dissected off bluntly. A similar process was carried out on the inferior aspect of the facial incision. The rectus muscles were separated in the midline bluntly and the peritoneum was entered bluntly. Attention was turned to the lower uterine segment where a transverse hysterotomy was made with a scalpel and extended bilaterally bluntly. The bladder blade was then removed. The infant was successfully delivered, and cord was clamped and cut and infant was handed over to awaiting neonatology team. Uterine massage was then administered and the placenta delivered intact with three-vessel cord. The uterus was then cleared of clot and debris.  The hysterotomy was closed with 0 Vicryl in a running locked fashion, and an imbricating layer was also placed with a 0 Vicryl. Overall, excellent hemostasis was noted. The abdomen and the pelvis were cleared of all clot and debris. Hemostasis was confirmed on all surfaces.  The fascia was then closed using 0 Vicryl in a running fashion.  The skin was closed with 4-0 Vicryl. The patient tolerated the procedure well. Sponge, lap, instrument and needle counts were correct x 2. She was taken to the recovery room in stable condition.    Alexis [name] Alexis [name] 05/22/2011 1:20 PM

## 2011-05-22 NOTE — Progress Notes (Signed)
Late documentation:  Called to patient's bedside at approximately 11:20 due to increased contractions and pressure.  Pt examined and found to be complete with bulging amniotic sac.  Patient placed in trendelenburg, terbutaline given x1 dose.  Bedside ultrasound done, showing vertex, vertex.  Dr Jolayne Panther was called and notified.  As she was in the OR and unavailable, patient was wheeled to OR and Dr Stefano Gaul, then Dr Pennie Rushing responded to overhead page for stand-by, should Baby B turn breech after delivery of twin A.  Artificial ROM was preformed.  Dr Debroah Loop then arrived for stand-by, relieving Dr Pennie Rushing.  Baby A then delivered vaginally.  The patient was then ultrasounded and Baby B had turned breech.  Dr Jolayne Panther then arrived.  The patient was then prepped and draped for cesarean delivery with Dr Jolayne Panther.  Risks were discussed with patient, including infection, bleeding, damage to uterus, intestines, bladder, tubes, and ovaries.   Please see delivery note and operative note for details.  Isamar Nazir JEHIEL 05/22/2011. 1:16 PM

## 2011-05-22 NOTE — Progress Notes (Signed)
Can't trace 2 separate HR on fetal monitor. CNM Kim at bedside, performed bedside ultrasound, 2 heartbeats detected.

## 2011-05-22 NOTE — Progress Notes (Signed)
05/22/11 1300  Clinical Encounter Type  Visited With Family;Patient not available (Pt's mom, brother Brett Canales, sister Waldron Session, niece Faith (4).)  Visit Type Patient in surgery;Social support;Spiritual support  Recommendations Spiritual Care will follow for spiritual and emotional support.  Spiritual Encounters  Spiritual Needs Emotional (Logistical support)  Stress Factors  Patient Stress Factors Major life changes (Sudden, unexpected premature delivery.  )    Heard overhead page requesting OB to antenatal; provided logistical support as Ms. Elmes was taken to OR.  Greeted Ms. Weldy's family (including mom, who is now back in OR with Ms. Greenbaum; brother Brett Canales; sister Waldron Session and her daughter Rushie Goltz, 61) at visitors' desk.  Helped them get connected with OR staff to facilitate Ms. Bibbins's mother's getting back to OR.   Provided pastoral presence, logistical support, crayons and paper to Faith, tissues/snacks/water, empathic listening, and introduction to ongoing chaplain services.  Family is aware of option to request chaplain whenever support is needed and that chaplain will follow up tomorrow as well.  Spiritual Care will continue to follow for support.  Avis Epley, [place] Chaplain [phone]

## 2011-05-22 NOTE — Anesthesia Postprocedure Evaluation (Signed)
Anesthesia Post Note  Patient: Alexis [name]  Procedure(s) Performed: Procedure(s) (LRB): CESAREAN SECTION (N/A)  Anesthesia type: Spinal  Patient location: PACU  Post pain: Pain level controlled  Post assessment: Post-op Vital signs reviewed  Last Vitals:  Filed Vitals:   05/22/11 0905  BP: 102/61  Pulse: 103  Temp: 36.9 C  Resp: 24    Post vital signs: Reviewed  Level of consciousness: awake  Complications: No apparent anesthesia complications

## 2011-05-22 NOTE — Anesthesia Preprocedure Evaluation (Signed)
Anesthesia Evaluation  Patient identified by MRN, date of birth, ID band Patient awake    Reviewed: Allergy & Precautions, H&P , Patient's Chart, lab work & pertinent test results  Airway Mallampati: I TM Distance: >3 FB Neck ROM: Full    Dental No notable dental hx. (+) Teeth Intact   Pulmonary neg pulmonary ROS,  breath sounds clear to auscultation  Pulmonary exam normal       Cardiovascular negative cardio ROS  Rhythm:Regular Rate:Normal     Neuro/Psych negative neurological ROS  negative psych ROS   GI/Hepatic negative GI ROS, Neg liver ROS,   Endo/Other  negative endocrine ROS  Renal/GU negative Renal ROS  negative genitourinary   Musculoskeletal   Abdominal Normal abdominal exam  (+)   Peds  Hematology negative hematology ROS (+)   Anesthesia Other Findings   Reproductive/Obstetrics (+) Pregnancy 25 + week twin gestation                            Anesthesia Physical Anesthesia Plan  ASA: II and Emergent  Anesthesia Plan: Spinal   Post-op Pain Management:    Induction:   Airway Management Planned:   Additional Equipment:   Intra-op Plan:   Post-operative Plan:   Informed Consent: I have reviewed the patients History and Physical, chart, labs and discussed the procedure including the risks, benefits and alternatives for the proposed anesthesia with the patient or authorized representative who has indicated his/her understanding and acceptance.     Plan Discussed with: Anesthesiologist, Surgeon and CRNA  Anesthesia Plan Comments:         Anesthesia Quick Evaluation

## 2011-05-22 NOTE — Consult Note (Signed)
Neonatology Note:  Attendance at C-section:  I was asked to attend this primary C/S at [redacted] weeks GA due to breech presentation and PTL; pregnancy complicated by twin gestation. The mother is a G1P0 A pos, GBS neg admitted 3/14 for short cervix. She received magnesium sulfate prophylaxis, betamethasone times 2 doses, Pen G, and was in Trendelenberg position, but noted to have bulging amniotic bag today. ROM at delivery, fluid clear.  This infant, Twin B, had some movement and an audible cry, HR about 70 at birth. She appeared quite bruised. We bulb suctioned, then dried her quickly and placed her into a portawarmer bag. We applied PPV for about 1 1/2 minutes and had good air exchange, but the HR remained less than 100. Intubation with a 2.5 mm ETT was attempted and accomplished on the third attempt; there were copious oral secretions and anterior cords. The baby was crying occasionally between attempts and maintained good HR throughout. Intubation was atraumatic at 6 minutes of life; the CO2 detector turned yellow immediately and there were equal breath sounds. The tube was secured and 2 ml of Infasurf was given at 10 minutes of life, tolerated well. Ap 5/9. I spoke with the mother briefly in the OR, showing her the infant, then transported the baby to the NICU for further care.  Bao Bazen, MD  

## 2011-05-22 NOTE — Transfer of Care (Signed)
Immediate Anesthesia Transfer of Care Note  Patient: Alexis [name]  Procedure(s) Performed: Procedure(s) (LRB): CESAREAN SECTION (N/A)  Patient Location: PACU  Anesthesia Type: Spinal  Level of Consciousness: awake, alert  and oriented  Airway & Oxygen Therapy: Patient Spontanous Breathing  Post-op Assessment: Report given to PACU RN and Post -op Vital signs reviewed and stable  Post vital signs: Reviewed and stable  Complications: No apparent anesthesia complications

## 2011-05-23 ENCOUNTER — Encounter (HOSPITAL_COMMUNITY): Payer: Self-pay | Admitting: Obstetrics and Gynecology

## 2011-05-23 LAB — CBC
MCH: 31.9 pg (ref 26.0–34.0)
MCHC: 32.7 g/dL (ref 30.0–36.0)
Platelets: 241 10*3/uL (ref 150–400)
RBC: 3.6 MIL/uL — ABNORMAL LOW (ref 3.87–5.11)
RDW: 14.1 % (ref 11.5–15.5)

## 2011-05-23 NOTE — Progress Notes (Signed)
Subjective: Postpartum Day 1: Cesarean Delivery Patient reports tolerating PO and no problems voiding.  Pain controlled with Ibuprofen Breast feeding, (Pumping)  Objective: Vital signs in last 24 hours: Temp:  [97.7 F (36.5 C)-98.4 F (36.9 C)] 98.2 F (36.8 C) (03/21 0526) Pulse Rate:  [64-138] 69  (03/21 0526) Resp:  [15-28] 18  (03/21 0526) BP: (97-122)/(52-87) 116/78 mmHg (03/21 0526) SpO2:  [95 %-100 %] 99 % (03/21 0526) Weight:  [93.441 kg (206 lb)] 93.441 kg (206 lb) (03/20 1500)  Physical Exam:  General: alert, cooperative and no distress Lochia: appropriate Uterine Fundus: firm at u-o Incision: no significant drainage, no dehiscence DVT Evaluation: No evidence of DVT seen on physical exam.   Basename 05/23/11 0525  HGB 11.5*  HCT 35.2*    Assessment/Plan: Status post Cesarean section. Doing well postoperatively.  Continue current care.  Kadeidra Coryell V 05/23/2011, 7:13 AM

## 2011-05-23 NOTE — Anesthesia Postprocedure Evaluation (Signed)
  Anesthesia Post-op Note  Patient: Alexis [name]  Procedure(s) Performed: Procedure(s) (LRB): CESAREAN SECTION (N/A)  Patient Location: [facility]  Anesthesia Type: Spinal  Level of Consciousness: awake, alert  and oriented  Airway and Oxygen Therapy: Patient Spontanous Breathing  Post-op Pain: none  Post-op Assessment: Post-op Vital signs reviewed, Patient's Cardiovascular Status Stable, No headache, No backache, No residual numbness and No residual motor weakness  Post-op Vital Signs: Reviewed and stable  Complications: No apparent anesthesia complications

## 2011-05-23 NOTE — Progress Notes (Signed)
Received referral to follow up with Sherea from chaplain, Misty [name], who saw her yesterday.  Pt was in good spirits.  She said that the neonatal nurse had given her good reports on babies, Dow Adolph and Myrtha Mantis (not sure of spelling).  Pt has good support from her mother.  I offered compassionate listening and presence as she related the events of the day before.    Please page as needed or as pt requests.  [phone].  Chaplain Orpha Bur Quamel Fitzmaurice 11:10 AM   05/23/11 1100  Clinical Encounter Type  Visited With Patient  Visit Type Follow-up  Referral From Chaplain  Spiritual Encounters  Spiritual Needs (Pt seemed in good spirits at time of visit.)

## 2011-05-23 NOTE — Addendum Note (Signed)
Addendum  created 05/23/11 1016 by Shanon Payor, CRNA   Modules edited:Notes Section

## 2011-05-23 NOTE — Progress Notes (Signed)
UR Chart review completed.  

## 2011-05-23 NOTE — Progress Notes (Signed)
Post Partum Day #1 Subjective: no complaints, up ad lib, voiding, tolerating PO and + flatus Patient doing well with no concerns. Pumping breastmilk. Both babies in NICU. Ambulating with no problems.   Objective: Blood pressure 116/78, pulse 69, temperature 98.2 F (36.8 C), temperature source Oral, resp. rate 18, height 5\' 3"  (1.6 m), weight 93.441 kg (206 lb), last menstrual period 11/28/2010, SpO2 99.00%, unknown if currently breastfeeding.  Physical Exam:  General: alert, cooperative and no distress Lochia: appropriate Uterine Fundus: firm Incision: healing well, no significant drainage, dressing clean dry and intact DVT Evaluation: No evidence of DVT seen on physical exam.   Basename 05/23/11 0525  HGB 11.5*  HCT 35.2*    Assessment/Plan: Plan for discharge tomorrow, Breastfeeding, Lactation consult, Social Work consult and Contraception IUD   LOS: 7 days   Deundra Furber 05/23/2011, 7:47 AM

## 2011-05-24 NOTE — Progress Notes (Signed)
Clinical Social Work Department PSYCHOSOCIAL ASSESSMENT - MATERNAL/CHILD 05/24/2011  Patient:  Alexis [name], Alexis [name]  Account Number:  [id number]  Admit Date:  05/16/2011  Marjo Bicker Name:   Wenda Overland  Meriel Pica    Clinical Social Worker:  Lulu Riding, LCSW   Date/Time:  05/23/2011 02:00 PM  Date Referred:  05/23/2011   Referral source  NICU  NICU     Referred reason  NICU   Other referral source:    I:  FAMILY / HOME ENVIRONMENT Child's legal guardian:  PARENT  Guardian - Name Guardian - Age Guardian - Address  Joliana Claflin [address], [place], [place] [address]  Crawford Givens involved     Other household support members/support persons Name Relationship DOB   GRAND MOTHER    SISTER    Other support:   cousin, best friend    II  PSYCHOSOCIAL DATA Information Source:  Patient Interview  Event organiser Employment:   MOB works at [employer] on [employer]. [employer] resources:  Medicaid If Medicaid - County:  [place] Other  [facility]   School / Grade:   Maternity Care Coordinator / Child Services Coordination / Early Interventions:   [facility]  Cultural issues impacting care:   none known    III  STRENGTHS Strengths  Adequate Resources  Compliance with medical plan  Other - See comment  Supportive family/friends  Understanding of illness   Strength comment:  MOB has a pediatrician list and will let staff know who she selects.   IV  RISK FACTORS AND CURRENT PROBLEMS Current Problem:  None   Risk Factor & Current Problem Patient Issue Family Issue Risk Factor / Current Problem Comment   N N     V  SOCIAL WORK ASSESSMENT SW met with MOB in her third floor room to introduce myself, complete assessment and evlauate how she is coping with babies' premature births and admissions to NICU.  MOB was very friendly and states that things happened very quickly and unexpectedly, but that she thinks she is doing fairly well at this  time.  She states that the situation is "scary" and seemed to respond well to SW intervention and support.  We discussed what to expect from the NICU in general terms, as well as emotions and positive coping skills.  SW explained support services offered by NICU SW and encouraged MOB to contact SW if needs or questions arise.  MOB reports having a good support system and being in the process of getting baby items.  She is excited about the babies, although she was initially in shock that she was having twins.  She states that she and FOB were in a relationship at one time, but had broken up before she found out she was pregnant and that he has not been involved or supportive during the pregnancy.  SW explained babies's eligibility for SSI and assisted MOB in completing the applications.  SW will send once copies of the Mother's Verification of Facts is completed and obtained.  MOB states no other questions or needs at this time.  SW gave MOB an unlimited 31 day bus pass to ensure that transportation is not a barrier to visiting with babies.      VI SOCIAL WORK PLAN Social Work Plan  Psychosocial Support/Ongoing Assessment of Needs   Type of pt/family education:   If child protective services report - county:   If child protective services report - date:   Information/referral to community resources  comment:   SSI   Other social work plan:

## 2011-05-24 NOTE — Progress Notes (Signed)
I have seen and examined this patient in conjunction with [name], PA-S.  I have taken this history and performed the exam.  I agree with the note as written above and have made corrections as needed.   Alexis [name] 05/24/2011 8:50 AM

## 2011-05-24 NOTE — Progress Notes (Signed)
Post Partum Day/ POD #2  Subjective:  Alexis [name] is a 26 yo G1P1002 at PPD/POD #2 after NSVD/LTCS doing well with no complaints at this time.  Her pain is well controlled with Ibuprofen and Percocet.  She has minimal lochia.  She notes no drainage with her incision. She is urinating, walking, passing stools and gas without difficulty. She continues to pump with intent to breast feed.  She plans having IUD for future family planning.  She will follow up at [facility].  She does want her son circumcised, but has questions about timeframe since he is born preterm.   no complaints, up ad lib, voiding, tolerating PO and + flatus  Objective: Blood pressure 121/76, pulse 87, temperature 98.3 F (36.8 C), temperature source Oral, resp. rate 18, height 5\' 3"  (1.6 m), weight 93.441 kg (206 lb), last menstrual period 11/28/2010, SpO2 100.00%, unknown if currently breastfeeding.  Physical Exam:  General: alert, cooperative, appears stated age and no distress Lochia: appropriate Uterine Fundus: firm Incision: healing well, no significant drainage, no dehiscence, no significant erythema DVT Evaluation: No evidence of DVT seen on physical exam.   Basename 05/23/11 0525  HGB 11.5*  HCT 35.2*    Assessment/Plan: Continue postpartum/ post op care.  Will discuss her concerns regarding circumcision with Dr. Adrian Blackwater. Plan for discharge tomorrow   LOS: 8 days   [name] 05/24/2011, 7:39 AM

## 2011-05-24 NOTE — Progress Notes (Signed)
I followed up with Jami today who was in good spirits and had family support.    Please page as needed or as pt requests.  [phone]  Chaplain Katy Marshae Azam 4:05 PM

## 2011-05-25 DIAGNOSIS — O30009 Twin pregnancy, unspecified number of placenta and unspecified number of amniotic sacs, unspecified trimester: Secondary | ICD-10-CM

## 2011-05-25 DIAGNOSIS — O0990 Supervision of high risk pregnancy, unspecified, unspecified trimester: Secondary | ICD-10-CM

## 2011-05-25 MED ORDER — IBUPROFEN 600 MG PO TABS: 600.0000 mg | ORAL_TABLET | Freq: Four times a day (QID) | ORAL | Status: AC | PRN

## 2011-05-25 MED ORDER — OXYCODONE-ACETAMINOPHEN 5-325 MG PO TABS: 1.0000 | ORAL_TABLET | ORAL | Status: AC | PRN

## 2011-05-25 NOTE — Discharge Summary (Signed)
Obstetric Discharge Summary Reason for Admission: Cervix noted to be effaced Prenatal Procedures: NST Intrapartum Procedures: spontaneous vaginal delivery and cesarean: low cervical, transverse Postpartum Procedures: none Complications-Operative and Postpartum: none except NICU infants Hemoglobin  Date Value Range Status  05/23/2011 11.5* 12.0-15.0 (g/dL) Final     HCT  Date Value Range Status  05/23/2011 35.2* 36.0-46.0 (%) Final  Hospital Course: This is a 26 y.o. who was admitted with a twin gestation at [redacted]w[redacted]d and found to have no measurable cervix.  Twins were noted to have good FHR, good concordance in weight, normal AFV x2, no other concerning signs or symptoms. External os was noted to be dilated to about 2.5 cm, no measurable cervical length. Fetal presentation is cephalic for Twin A and cephalic/oblique for Twin B.   She was placed on Bedrest, Trendelenberg position.  She was given BMZ regimen, magnesium sulfate ordered for tocolysis and neuroprotection, and Progesterone per vagina ordered to help with cervix.  PCN ordered for GBS unknown in setting of prematurity and NICU and MFM consults ordered   On 05/22/11, her cervix was noted to be completely dilated with a bulging bag.  The first baby was delivered vaginally, then Twin B was found to be breech and was delivered via C/S.    The patient has done well. She is voiding and having Bms without problem. No nausea. Pain is well controlled. Babies are in NICU. She is deemed to have received the full benefit of her hospital stay and is discharged home.     Physical Exam:  General: alert and no distress Lochia: appropriate Uterine Fundus: firm Incision: healing well, no significant drainage, no dehiscence  Steristrips in place DVT Evaluation: No evidence of DVT seen on physical exam.  Discharge Diagnoses: Term Pregnancy-delivered and Twins, delivered vaginally and cesarean  Discharge Information: Date: 05/25/2011 Activity:  unrestricted and pelvic rest Diet: routine Medications: Ibuprofen and Percocet Condition: stable Instructions: refer to practice specific booklet Discharge to: home Follow-up Information    Follow up with [facility]. Schedule an appointment as soon as possible for a visit in 6 weeks. (As needed if symptoms worsen)          Newborn Data:   Christen, Wardrop [[id number]]  Live born female  Birth Weight: 1 lb 11.2 oz (770 g) APGAR: 5, 7   Taila, Basinski [[id number]]  Live born female  Birth Weight: 1 lb 6.2 oz (629 g) APGAR: 5, 9  Both staying in NICU  [facility] 05/25/2011, 6:24 AM

## 2011-05-25 NOTE — Progress Notes (Signed)
D/C instructions reviewed with pt. And family.  Both verbally state understanding of home care.  No home equipment needed.  Ambulated to car with staff without incident.  D/C'd home with family.

## 2011-05-25 NOTE — Discharge Summary (Signed)
Agree with above note.  Alexis [name] 05/25/2011 6:52 AM

## 2011-05-25 NOTE — Discharge Instructions (Signed)

## 2011-05-27 NOTE — Op Note (Signed)
Agree with above note.  Alexis [name] 05/27/2011 9:19 AM

## 2011-05-30 ENCOUNTER — Encounter: Payer: Medicaid Other | Admitting: Family Medicine

## 2011-06-13 ENCOUNTER — Encounter: Payer: Self-pay | Admitting: Advanced Practice Midwife

## 2011-06-13 ENCOUNTER — Ambulatory Visit (INDEPENDENT_AMBULATORY_CARE_PROVIDER_SITE_OTHER): Payer: Medicaid Other | Admitting: Advanced Practice Midwife

## 2011-06-13 DIAGNOSIS — Z3009 Encounter for other general counseling and advice on contraception: Secondary | ICD-10-CM

## 2011-06-13 LAB — POCT PREGNANCY, URINE: Preg Test, Ur: NEGATIVE

## 2011-06-13 MED ORDER — PRENATAL MULTIVITAMIN CH: 1.0000 | ORAL_TABLET | Freq: Every day | ORAL | Status: DC

## 2011-06-13 MED ORDER — FERROUS SULFATE 325 (65 FE) MG PO TABS: 325.0000 mg | ORAL_TABLET | Freq: Two times a day (BID) | ORAL | Status: DC

## 2011-06-13 NOTE — Progress Notes (Signed)
Pt needs refill on iron Rx

## 2011-06-13 NOTE — Progress Notes (Signed)
  Subjective:     Alexis [name] is a 26 y.o. female who presents for a postpartum visit. She is 3 weeks postpartum following a SVD or baby A and low cervical transverse Cesarean section of baby B. I have fully reviewed the prenatal and intrapartum course. The delivery was at 25 gestational weeks. Outcome: spontaneous vaginal delivery and primary cesarean section, low transverse incision. Anesthesia:  . Postpartum course has been normal. Babies courses have been routine for preterm infants. Babies are feeding by pumped breastmilk. Bleeding staining only. Bowel function is normal. Bladder function is normal. Patient is not sexually active. Contraception method is none. Postpartum depression screening: negative.  The following portions of the patient's history were reviewed and updated as appropriate: allergies, current medications, past family history, past social history, past surgical history and problem list.  Review of Systems Pertinent items are noted in HPI.   Objective:    BP 125/88  Pulse 67  Temp(Src) 97.2 F (36.2 C) (Oral)  Ht 5\' 4"  (1.626 m)  Wt 188 lb (85.276 kg)  BMI 32.27 kg/m2  Breastfeeding? No  General:  alert, oriented, NAD   Breasts:  inspection negative, no nipple discharge or bleeding, no masses or nodularity palpable  Lungs: clear to auscultation bilaterally  Heart:  regular rate and rhythm, S1, S2 normal, no murmur, click, rub or gallop  Abdomen: soft, non-tender; bowel sounds normal; no masses,  no organomegaly. Incision healing well, no discharge or erythema   Vulva:  normal  Vagina: normal vagina  Cervix:  no cervical motion tenderness  Corpus: normal size, contour, position, consistency, mobility, non-tender  Adnexa:  normal adnexa and no mass, fullness, tenderness  Rectal Exam: Not performed.        UPT neg Assessment:    normal postpartum exam. Pap smear not done at today's visit.   Plan:    1. Contraception: Mirena in 2 weeks 2. Refills of Iron  and PNV. 3. Follow up in: 2 weeks or as needed.

## 2011-07-10 ENCOUNTER — Encounter: Payer: Self-pay | Admitting: Advanced Practice Midwife

## 2011-07-10 ENCOUNTER — Encounter: Payer: Self-pay | Admitting: Obstetrics and Gynecology

## 2011-07-10 ENCOUNTER — Ambulatory Visit (INDEPENDENT_AMBULATORY_CARE_PROVIDER_SITE_OTHER): Payer: Medicaid Other | Admitting: Advanced Practice Midwife

## 2011-07-10 VITALS — BP 121/78 | HR 60 | Temp 98.3°F | Ht 63.0 in | Wt 186.8 lb

## 2011-07-10 DIAGNOSIS — Z01812 Encounter for preprocedural laboratory examination: Secondary | ICD-10-CM

## 2011-07-10 DIAGNOSIS — Z30430 Encounter for insertion of intrauterine contraceptive device: Secondary | ICD-10-CM

## 2011-07-10 DIAGNOSIS — Z3049 Encounter for surveillance of other contraceptives: Secondary | ICD-10-CM

## 2011-07-10 MED ORDER — LEVONORGESTREL 20 MCG/24HR IU IUD
INTRAUTERINE_SYSTEM | Freq: Once | INTRAUTERINE | Status: AC
  Administered 2011-07-10: 1 via INTRAUTERINE

## 2011-07-10 MED ORDER — LEVONORGESTREL 20 MCG/24HR IU IUD: 1.0000 | INTRAUTERINE_SYSTEM | Freq: Once | INTRAUTERINE | Status: DC

## 2011-07-10 NOTE — Progress Notes (Signed)
IUD Insertion Procedure Note  Pre-operative Diagnosis: Undesired fertility  Post-operative Diagnosis: same  Indications: contraception  Procedure Details  Urine pregnancy test was done today and result was negative. No IC x 1 month. Patient's last menstrual period was 07/08/2011.  The risks (including infection, bleeding, pain, and uterine perforation) and benefits of the procedure were explained to the patient and Written informed consent was obtained.    Cervix cleansed with Betadine. Uterus sounded to 7 cm with inserter. IUD inserted without difficulty. String visible and trimmed. Patient tolerated procedure well.  IUD Information: Mirena.  Condition: Stable  Complications: None  Plan:  The patient was advised to call for any fever or for prolonged or severe pain or bleeding. She was advised to use OTC ibuprofen as needed for mild to moderate pain.   Attending Physician Documentation: I was performed the entire procedure, including opening and closing.  F/U in 6 weeks PRN  Dorathy Kinsman 07/10/2011 3:43 PM

## 2011-07-10 NOTE — Patient Instructions (Signed)
Intrauterine Device Insertion Care After Refer to this sheet in the next few weeks. These instructions provide you with information on caring for yourself after your procedure. Your caregiver may also give you more specific instructions. Your treatment has been planned according to current medical practices, but problems sometimes occur. Call your caregiver if you have any problems or questions after your procedure. HOME CARE INSTRUCTIONS   Only take over-the-counter or prescription medicines for pain, discomfort, or fever as directed by your caregiver. Do not use aspirin. This may increase bleeding.   Check your IUD to make sure it is in place before you resume sexual activity. You should be able to feel the strings. If you cannot feel the strings, something may be wrong. The IUD may have fallen out of the uterus, or the uterus may have been punctured (perforated) during placement. Also, if the strings are getting longer, it may mean that the IUD is being forced out of the uterus. You no longer have full protection from pregnancy if any of these problems occur.   You may resume sexual intercourse if you are not having problems with the IUD. The IUD is considered immediately effective.   You may resume normal activities.   Keep all follow-up appointments to be sure your IUD has remained in place. After the first exam, yearly exams are advised, unless you cannot feel the strings of your IUD.   Continue to check that the IUD is still in place by feeling for the strings after every menstrual period.  SEEK MEDICAL CARE IF:   You have bleeding that is heavier or lasts longer than a normal menstrual cycle.   You have a fever.   You have increasing cramps or abdominal pain not relieved with medicine.   You have abdominal pain that does not seem to be related to the same area of earlier cramping and pain.   You are lightheaded, unusually weak, or faint.   You have abnormal vaginal discharge or  smells.   You have pain during sexual intercourse.   You cannot feel the IUD strings, or the IUD string has gotten longer.   You feel the IUD at the opening of the cervix in the vagina.   You think you are pregnant, or you miss your menstrual period.   The IUD string is hurting your sex partner.  Document Released: 10/17/2010 Document Revised: 02/07/2011 Document Reviewed: 10/17/2010 ExitCare Patient Information 2012 [employer]. 

## 2011-08-14 ENCOUNTER — Ambulatory Visit (INDEPENDENT_AMBULATORY_CARE_PROVIDER_SITE_OTHER): Payer: Medicaid Other | Admitting: Obstetrics and Gynecology

## 2011-08-14 ENCOUNTER — Encounter: Payer: Self-pay | Admitting: Obstetrics and Gynecology

## 2011-08-14 VITALS — BP 117/81 | HR 74 | Temp 97.2°F | Ht 63.0 in | Wt 189.7 lb

## 2011-08-14 DIAGNOSIS — Z975 Presence of (intrauterine) contraceptive device: Secondary | ICD-10-CM

## 2011-08-14 NOTE — Patient Instructions (Signed)
Intrauterine Device Information An intrauterine device (IUD) is inserted into your uterus and prevents pregnancy. There are 2 types of IUDs available:  Copper IUD. This type of IUD is wrapped in copper wire and is placed inside the uterus. Copper makes the uterus and fallopian tubes produce a fluid that kills sperm. The copper IUD can stay in place for 10 years.   Hormone IUD. This type of IUD contains the hormone progestin (synthetic progesterone). The hormone thickens the cervical mucus and prevents sperm from entering the uterus, and it also thins the uterine lining to prevent implantation of a fertilized egg. The hormone can weaken or kill the sperm that get into the uterus. The hormone IUD can stay in place for 5 years.  Your caregiver will make sure you are a good candidate for a contraceptive IUD. Discuss with your caregiver the possible side effects. ADVANTAGES  It is highly effective, reversible, long-acting, and low maintenance.   There are no estrogen-related side effects.   An IUD can be used when breastfeeding.   It is not associated with weight gain.   It works immediately after insertion.   The copper IUD does not interfere with your female hormones.   The progesterone IUD can make heavy menstrual periods lighter.   The progesterone IUD can be used for 5 years.   The copper IUD can be used for 10 years.  DISADVANTAGES  The progesterone IUD can be associated with irregular bleeding patterns.   The copper IUD can make your menstrual flow heavier and more painful.   You may experience cramping and vaginal bleeding after insertion.  Document Released: 01/23/2004 Document Revised: 02/07/2011 Document Reviewed: 06/23/2010 ExitCare Patient Information 2012 [employer]. 

## 2011-08-14 NOTE — Progress Notes (Signed)
Subjective: 26 year old G1 P0102 had Mirena IUD inserted here on 07/10/2011 and presents for spring check. She has not checked it herself. She or her partner do not feel the string during intercourse. She had a light period 3 days ago and a couple other incidences of spotting since the Mirena was inserted. No irritative vaginal discharge and no other concerns.  Objective: Filed Vitals:   08/14/11 1459  BP: 117/81  Pulse: 74  Temp: 97.2 F (36.2 C)   Gen.: Pleasant AAF in NAD Abdomen: Soft nontender Pelvic: NEFG, vagina with menstrual blood, IUD strings are in place protruding about 2,5 cm from external os, cervix clean. Bimanual no CMT, uterus NSSP, no adnexal tenderness or masses   Assessment: Mirena IUD in place  Plan: Patient reassured and is advised to return 01/22/2012 for Pap and annual exam Alexis [name] 08/14/2011 3:30 PM

## 2011-12-18 ENCOUNTER — Ambulatory Visit (INDEPENDENT_AMBULATORY_CARE_PROVIDER_SITE_OTHER): Payer: Self-pay | Admitting: Advanced Practice Midwife

## 2011-12-18 ENCOUNTER — Encounter: Payer: Self-pay | Admitting: Advanced Practice Midwife

## 2011-12-18 VITALS — BP 106/72 | HR 78 | Temp 98.9°F | Ht 62.0 in | Wt 201.2 lb

## 2011-12-18 DIAGNOSIS — Z30011 Encounter for initial prescription of contraceptive pills: Secondary | ICD-10-CM

## 2011-12-18 DIAGNOSIS — Z3009 Encounter for other general counseling and advice on contraception: Secondary | ICD-10-CM

## 2011-12-18 DIAGNOSIS — Z30431 Encounter for routine checking of intrauterine contraceptive device: Secondary | ICD-10-CM

## 2011-12-18 MED ORDER — NORGESTIMATE-ETH ESTRADIOL 0.25-35 MG-MCG PO TABS: 1.0000 | ORAL_TABLET | Freq: Every day | ORAL | Status: DC

## 2011-12-18 NOTE — Progress Notes (Signed)
Subjective:     Patient ID: Alexis [name], female   DOB: Nov 09, 1985, 26 y.o.   MRN: [id number]  HPI 26 y.o. [id number] presents to Gyn clinic for Mirena string check.  Her Mirena was placed in May 2013.  She reports checking her string last week and it was normal, but this week she felt the string was longer and "like something was coming out above the string".  She denies abdominal pain, vaginal bleeding, dizziness, n/v, or fever/chills.   Review of Systems Pertinent ROS in HPI    Objective:   Physical Exam BP 106/72  Pulse 78  Temp 98.9 F (37.2 C) (Oral)  Ht 5\' 2"  (1.575 m)  Wt 91.264 kg (201 lb 3.2 oz)  BMI 36.80 kg/m2  LMP 12/08/2011  Breastfeeding? No  Pelvic exam: Cervix pink, visually closed, without lesion, IUD visible at cervical os, partially outside of os, scant white creamy discharge, vaginal walls and external genitalia normal  IUD removed with ring forceps, without difficulty     Assessment:     1. Family planning, IUD (intrauterine device) check/reinsertion/removal   2. Oral contraceptive prescribed   3. General counseling and advice for contraceptive management    Plan:     Discussed risks of pregnancy with pt and contraceptive choices Sprintec prescribed today--encouraged same day start for pills Financial paperwork completed to order Mirena without insurance Pt will make appointment and pay insertion fee if Mirena available at no cost

## 2011-12-19 ENCOUNTER — Ambulatory Visit: Payer: Self-pay | Admitting: Advanced Practice Midwife

## 2013-04-12 ENCOUNTER — Encounter (HOSPITAL_COMMUNITY): Payer: Self-pay | Admitting: Emergency Medicine

## 2013-04-12 ENCOUNTER — Emergency Department (HOSPITAL_COMMUNITY)
Admission: EM | Admit: 2013-04-12 | Discharge: 2013-04-12 | Disposition: A | Discharge status: Home / Self Care | Payer: No Typology Code available for payment source | Attending: Emergency Medicine | Admitting: Emergency Medicine

## 2013-04-12 DIAGNOSIS — Z79899 Other long term (current) drug therapy: Secondary | ICD-10-CM | POA: Insufficient documentation

## 2013-04-12 DIAGNOSIS — J069 Acute upper respiratory infection, unspecified: Secondary | ICD-10-CM | POA: Insufficient documentation

## 2013-04-12 DIAGNOSIS — Z872 Personal history of diseases of the skin and subcutaneous tissue: Secondary | ICD-10-CM | POA: Insufficient documentation

## 2013-04-12 MED ORDER — BENZONATATE 100 MG PO CAPS: 200.0000 mg | ORAL_CAPSULE | Freq: Two times a day (BID) | ORAL | Status: DC | PRN

## 2013-04-12 MED ORDER — DM-GUAIFENESIN ER 30-600 MG PO TB12: 1.0000 | ORAL_TABLET | Freq: Two times a day (BID) | ORAL | Status: DC

## 2013-04-12 NOTE — Progress Notes (Signed)
[facility] provided pt with a list of primary care resources. Patient stated that she was pending insurance through [facility].

## 2013-04-12 NOTE — Discharge Instructions (Signed)
Call for a follow up appointment with a Family or Primary Care Provider.  °Return if Symptoms worsen.   °Take medication as prescribed.  ° ° °Emergency Department Resource Guide °1) Find a Doctor and Pay Out of Pocket °Although you won't have to find out who is covered by your insurance plan, it is a good idea to ask around and get recommendations. You will then need to call the office and see if the doctor you have chosen will accept you as a new patient and what types of options they offer for patients who are self-pay. Some doctors offer discounts or will set up payment plans for their patients who do not have insurance, but you will need to ask so you aren't surprised when you get to your appointment. ° °2) Contact Your Local Health Department °Not all health departments have doctors that can see patients for sick visits, but many do, so it is worth a call to see if yours does. If you don't know where your local health department is, you can check in your phone book. The CDC also has a tool to help you locate your state's health department, and many state websites also have listings of all of their local health departments. ° °3) Find a Walk-in Clinic °If your illness is not likely to be very severe or complicated, you may want to try a walk in clinic. These are popping up all over the country in pharmacies, drugstores, and shopping centers. They're usually staffed by nurse practitioners or physician assistants that have been trained to treat common illnesses and complaints. They're usually fairly quick and inexpensive. However, if you have serious medical issues or chronic medical problems, these are probably not your best option. ° °No Primary Care Doctor: °- Call Health Connect at  832-8000 - they can help you locate a primary care doctor that  accepts your insurance, provides certain services, etc. °- Physician Referral Service- 1-800-533-3463 ° °Chronic Pain Problems: °Organization         Address  Phone    Notes  °[facility]  (336) 297-2271 Patients need to be referred by their primary care doctor.  ° °Medication Assistance: °Organization         Address  Phone   Notes  °[place] [facility] [address] °[place], [place] [address] (336) 641-8030 --Must be a resident of [place] °-- Must have NO insurance coverage whatsoever (no Medicaid/ Medicare, etc.) °-- The pt. MUST have a primary care doctor that directs their care regularly and follows them in the community °  °[facility]  (866) 331-1348   °[employer]  (888) 892-1162   ° °Agencies that provide inexpensive medical care: °Organization         Address  Phone   Notes  °[facility]  (336) 832-8035   °[facility]    (336) 832-7272   °[facility] [address] °[place], [place] [address] (336) 832-4777   °[facility] [address], °[place] (336) 271-4999   °[facility]    (336) 373-0678   °[facility]    (336) 272-1050   °[facility] ° [address], [place] Phone:  (336) 832-4444, Fax:  (336) 832-4440 Hours of Operation:  9 am - 6 pm, M-F.  Also accepts Medicaid/Medicare and self-pay.  °[facility] ° [address], [place]   Phone: (336) 832-3150, Fax: (336) 832-3151. Hours of Operation:  8:30 am - 5:30 pm, M-F.  Also accepts Medicaid and self-pay.  °[facility] [place] [address], [place] Phone: (336) 878-6027   °[facility] [address], [place], [place] (336)723-1848, Ext. 123 Mondays & Thursdays: 7-9 AM.  First 15 patients are seen on a first come, first serve basis. °  ° °Medicaid-accepting [place] Providers: ° °Organization         Address  Phone   Notes  °[facility] [address], [place] (336) 641-2100 Also accepts self-pay patients.  °[facility]  [address], [place] ° (336) 856-9996   °[facility] [address], [place] (336) 288-8857   °[facility] [address] [place] [address], [place] (336) 299-7000   °Veita Bland [address], [place]  ° (336) 373-1557 Only accepts [place] Access Medicaid patients after they have their name applied to their card.  ° °Self-Pay (no insurance) in [place]: ° °Organization         Address  Phone   Notes  °Sickle Cell Patients, [facility] [address], [place] (336) 832-1970   °[facility] [address], [place] (336) 832-4400   °[facility] ° [address], [place] (336) 992-4800   °[facility]/Dr. Osei-Bonsu ° [address] [place] [address], [place] or [address], [place] (336) 841-8500 Phone number for both [place] and [place] locations is the same.  °[facility] [address], [place] (336) 299-0000   °[facility] [address] [place] [address], [place] or [address] (336) 852-7530 °(336) 878-2260   °[facility] [address], [place] (336) 350-1642, phone; (336) 294-5005, fax Sees patients 1st and 3rd Saturday of every month.  Must not qualify for public or private insurance (i.e. Medicaid, Medicare, [place] Health Choice, Veterans' Benefits) • Household income should be no more than 200% of the poverty level •The clinic cannot treat you if you are pregnant or think you are pregnant • Sexually transmitted diseases are not treated at the clinic.  ° ° °Dental Care: °Organization         Address  Phone  Notes  °[facility] [address], [place] (336) 641-6152 Accepts children up to age 21 who are enrolled in Medicaid or [place] Health Choice; pregnant women with a Medicaid card; and children who have  applied for Medicaid or [place] Health Choice, but were declined, whose parents can pay a reduced fee at time of service.  °[facility] [place]  [address], [place] (336) 641-7733 Accepts children up to age 21 who are enrolled in Medicaid or [place] Health Choice; pregnant women with a Medicaid card; and children who have applied for Medicaid or [place] Health Choice, but were declined, whose parents can pay a reduced fee at time of service.  °[facility] ° [address], [place] (336) 641-4533 Patients are seen by appointment only. Walk-ins are not accepted. [facility] will see patients 18 years of age and older. °Monday - Tuesday (8am-5pm) °Most Wednesdays (8:30-5pm) °$30 per visit, cash only  °[facility] ° [address], [place] (336) 641-4533 Patients are seen by appointment only. Walk-ins are not accepted. [facility] will see patients 18 years of age and older. °One Wednesday Evening (Monthly: Volunteer Based).  $30 per visit, cash only  °[facility]  (919) 537-3737 for adults; Children under age 4, call Graduate Pediatric Dentistry at (919) [phone]. Children aged 4-14, please call (919) [phone] to request a pediatric application. ° Dental services are provided in all areas of dental care including fillings, crowns and bridges, complete and partial dentures, implants, gum treatment, root canals, and extractions. Preventive care is also provided. Treatment is provided to both adults and children. °Patients are selected via a lottery and there is often a waiting list. °  °[facility] [address], °[place] ° (336) 763-8833 [email or web address] °  °[facility] [address], [place], [place] (336)723-1848, Ext. 123 Second and Fourth Thursday of each month, opens at 6:30 AM; Clinic ends at 9 AM.  Patients are seen on a first-come first-served basis, and a  limited number are seen during each clinic.  ° °[facility] ° [address], [place], [place] (336) 723-7904   Eligibility Requirements °You must have lived in [place], [place], or [place] counties for at least the last three months. °  You cannot be eligible for state or federal sponsored healthcare insurance, including Veterans Administration, Medicaid, or Medicare. °  You generally cannot be eligible for healthcare insurance through your employer.  °  How to apply: °Eligibility screenings are held every Tuesday and Wednesday afternoon from 1:00 pm until 4:00 pm. You do not need an appointment for the interview!  °[facility] [address], [place], [place] 336-631-2330   °[facility]  336-342-8273   °[facility]  336-703-3100   °[facility]  336-570-6415   ° °Behavioral Health Resources in the Community: °Intensive Outpatient Programs °Organization         Address  Phone  Notes  °[facility] [address], [place], [place] 336-878-6098   °[facility] [address], [place], [place] 336-832-9800   °[facility] [address], [place], [place] ° 336-882-2125   °[facility] [address],  °[place], [place] 1-800-853-5163 or 336-641-4981   °Substance Abuse Resources °Organization         Address  Phone  Notes  °[facility]  336-882-2125   °[facility]  336-784-9470   °[facility]  336-285-9073   °[facility]  336-845-3988   °Residential & Outpatient Substance Abuse Program  1-800-659-3381   °Psychological Services °Organization         Address  Phone  Notes  ° Health  336- 832-9600   °[facility]  336- 378-7881   °[facility] [address], [place] 1-800-853-5163 or 336-641-4981   ° °Mobile Crisis Teams °Organization          Address  Phone  Notes  °[facility], Mobile Crisis Care Unit  1-877-626-1772   °Assertive °[facility] ° [address] [place], [place] 336-834-9664   °Sharon DeEsch [address] °[place] 336-554-5454   ° °Self-Help/Support Groups °Organization         Address  Phone             Notes  °[facility]  - variety of   support groups  336- 373-1402 Call for more information  °Narcotics Anonymous (NA), [facility] [address], °[place]  2 meetings at this location  ° °Residential Treatment Programs °Organization         Address  Phone  Notes  °[facility] [address],    °[place]  1-866-801-8205   °[facility] ° [address], [place], [place] 704-293-8524   °[facility] [address], [place] 336-845-3988 Admissions: 8am-3pm M-F  °[facility] [address]    °[place], [place] 336-841-1104   °[facility] [address], [place], [place] 336-379-7146   °[facility] [address]  °[place], [place] 336-285-9073   °[facility] - Intensive Outpatient [address], [place], [place] 336-852-3033   °[facility] [address]  °[place], [place] 1-877-615-2722 or 336-784-9470   °[facility] ([facility] [address] [place], [place] 336-227-7417 Accepts Medicaid  °[facility] [address]  °[place] 1-800-659-3381 Substance Abuse/Addiction Treatment  ° °[place] Behavioral Health Resources °Organization         Address  Phone  Notes  °[facility]  (888) 581-9988   °Julie Brannon, PhD [address] [place], [place]   (336) 349-5553 or (336) 951-0000   ° Behavioral   [address] °[place], [place] (336) 349-4454   °[facility] [address], [place], [place] (336) 342-8316 Insurance/Medicaid/sponsorship  through [facility]  °[facility] [address]                                    [place], [place] (336) 342-8316 Therapy/tele-psych/case  °[facility] [address]  ° [place], [place] (336) 349-2233    °Dr. Arfeen  (336) 349-4544   °[facility] [place]  [employer] [place] [facility] 1) [address], [place] °2) [address], [place] °3)  [address], [place] (336) 349-3220 °(336) 342-7768 ° °(336) 342-8140   °[place] Child Abuse Hotline (336) 342-1394 or (336) 342-3537 (After Hours)    ° °

## 2013-04-12 NOTE — ED Provider Notes (Signed)
CSN: [id number]     Arrival date & time 04/12/13  4098 History   First MD Initiated Contact with Patient 04/12/13 1019     Chief Complaint  Patient presents with  . Cough    x 2 weeks  . Fever    one episode last week     (Consider location/radiation/quality/duration/timing/severity/associated sxs/prior Treatment) HPI Comments: Alexis [name] is a 28 year-old female without a significant a past medical history, presenting the Emergency Department with a chief complaint of cough for 2 weeks.  The patient reports a productive cough, worsen at night.  She reports rhinorrhea, and nasal congestion.  She denies fever or chill.  Reports a post-tussive emesis last week.  She also reports a sore throat for 2 days. Reports taking Tamiflu without relief of symptoms. Denies known sick contacts.   The history is provided by the patient and medical records. No language interpreter was used.    Past Medical History  Diagnosis Date  . Boil of buttock 2002    at the top between buttocks, abcess I&D  . No pertinent past medical history    Past Surgical History  Procedure Laterality Date  . Incise and drain abcess      in buttocks area  . Cesarean section  05/22/2011    Procedure: CESAREAN SECTION;  Surgeon: Catalina Antigua, MD;  Location: [facility];  Service: Gynecology;  Laterality: N/A;   Family History  Problem Relation Age of Onset  . Hypertension Maternal Grandmother   . Asthma Maternal Grandmother   . Cancer Maternal Grandmother     cancer  . Cancer Paternal Grandmother     lung   History  Substance Use Topics  . Smoking status: Never Smoker   . Smokeless tobacco: Never Used  . Alcohol Use: Yes   OB History   Grav Para Term Preterm Abortions TAB SAB Ect Mult Living   1 1 0 1 0 0 0 0 1 2      Review of Systems  Constitutional: Positive for fever. Negative for chills.  HENT: Positive for congestion, postnasal drip and rhinorrhea.   Respiratory: Positive for cough.    Gastrointestinal: Negative for nausea, vomiting, abdominal pain, diarrhea and constipation.  Skin: Negative for rash.      Allergies  Review of patient's allergies indicates no known allergies.  Home Medications   Current Outpatient Rx  Name  Route  Sig  Dispense  Refill  . benzonatate (TESSALON) 100 MG capsule   Oral   Take 2 capsules (200 mg total) by mouth 2 (two) times daily as needed for cough.   20 capsule   0   . dextromethorphan-guaiFENesin (MUCINEX DM) 30-600 MG per 12 hr tablet   Oral   Take 1 tablet by mouth 2 (two) times daily.   12 tablet   0    BP 133/62  Pulse 75  Temp(Src) 99 F (37.2 C) (Oral)  Resp 16  SpO2 100%  LMP 03/15/2013  Breastfeeding? No Physical Exam  Nursing note and vitals reviewed. Constitutional: She appears well-developed and well-nourished. No distress.  HENT:  Head: Normocephalic and atraumatic.  Right Ear: Tympanic membrane and external ear normal. No middle ear effusion.  Left Ear: Tympanic membrane and external ear normal.  No middle ear effusion.  Nose: Mucosal edema and rhinorrhea present. Right sinus exhibits no maxillary sinus tenderness and no frontal sinus tenderness. Left sinus exhibits no maxillary sinus tenderness and no frontal sinus tenderness.  Mouth/Throat: Uvula is midline, oropharynx  is clear and moist and mucous membranes are normal. No oral lesions. No trismus in the jaw. No oropharyngeal exudate, posterior oropharyngeal edema, posterior oropharyngeal erythema or tonsillar abscesses.  Eyes: EOM are normal. Pupils are equal, round, and reactive to light. Right eye exhibits no discharge. Left eye exhibits no discharge.  Neck: Normal range of motion. Neck supple.  Cardiovascular: Normal rate and regular rhythm.   No murmur heard. Pulmonary/Chest: Effort normal and breath sounds normal. No respiratory distress. She has no wheezes. She has no rales.  Abdominal: Soft.  Lymphadenopathy:    She has no cervical  adenopathy.  Skin: Skin is warm and dry. No rash noted. She is not diaphoretic.  Psychiatric: She has a normal mood and affect. Her behavior is normal. Thought content normal.    ED Course  Procedures (including critical care time) Labs Review Labs Reviewed - No data to display Imaging Review No results found.  EKG Interpretation   None       MDM   Final diagnoses:  Upper respiratory infection   Pt with a two week history of cough.  Afebrile, oxygen saturation 100% RA, lungs clear bilaterally.  I don't feel that imaging is necessary at this time.  Throat clear without exudate, I don't feel that rapid strep is necessary at this time. Nasal congestion and cough, likely viral.  Will treat symptomatically. Discussed treatment plan with the patient. Return precautions given. Reports understanding and no other concerns at this time.  Patient is stable for discharge at this time.   Meds given in ED:  Medications - No data to display  New Prescriptions   BENZONATATE (TESSALON) 100 MG CAPSULE    Take 2 capsules (200 mg total) by mouth 2 (two) times daily as needed for cough.   DEXTROMETHORPHAN-GUAIFENESIN (MUCINEX DM) 30-600 MG PER 12 HR TABLET    Take 1 tablet by mouth 2 (two) times daily.        Clabe SealLauren M Larron Armor, PA-C 04/14/13 1155

## 2013-04-12 NOTE — ED Notes (Addendum)
Pt reports 2 week hx of cough, unresponsive to OTC meds. 2 day hx of sore throat. Denies NVD

## 2013-04-14 NOTE — ED Provider Notes (Signed)
Medical screening examination/treatment/procedure(s) were performed by non-physician practitioner and as supervising physician I was immediately available for consultation/collaboration.  Juanice Warburton L Ryan Palermo, MD 04/14/13 1443 

## 2013-09-17 ENCOUNTER — Ambulatory Visit (INDEPENDENT_AMBULATORY_CARE_PROVIDER_SITE_OTHER): Payer: No Typology Code available for payment source | Admitting: Emergency Medicine

## 2013-09-17 VITALS — BP 110/72 | HR 72 | Temp 98.7°F | Resp 16 | Ht 63.0 in | Wt 169.0 lb

## 2013-09-17 DIAGNOSIS — L42 Pityriasis rosea: Secondary | ICD-10-CM

## 2013-09-17 MED ORDER — ACYCLOVIR 800 MG PO TABS: 800.0000 mg | ORAL_TABLET | Freq: Every day | ORAL | Status: AC

## 2013-09-17 NOTE — Progress Notes (Signed)
[facility] [address], [place] [address] [phone]  Date:  09/17/2013   Name:  Alexis [name]   DOB:  [birth year]   MRN:  [id number]  PCP:  No PCP Per Patient    Chief Complaint: Rash   History of Present Illness:  Alexis [name] is a 28 y.o. very pleasant female patient who presents with the following:  Sudden onset of rash diffuse on uppers and trunk.  Pruritic.  No antecedent illness or allergen contact.  No sick contact.  No malaise or myalgia.  No improvement with over the counter medications or other home remedies. Denies other complaint or health concern today.   Patient Active Problem List   Diagnosis Date Noted  . Preterm delivery, delivered 07/10/2011  . Encounter for insertion of mirena IUD 07/10/2011  . Pregnancy, twins, delivered 05/25/2011  . Exposure to second hand smoke 02/14/2011    Past Medical History  Diagnosis Date  . Boil of buttock 2002    at the top between buttocks, abcess I&D  . No pertinent past medical history     Past Surgical History  Procedure Laterality Date  . Incise and drain abcess      in buttocks area  . Cesarean section  05/22/2011    Procedure: CESAREAN SECTION;  Surgeon: Catalina AntiguaPeggy Constant, MD;  Location: [facility];  Service: Gynecology;  Laterality: N/A;    History  Substance Use Topics  . Smoking status: Current Some Day Smoker  . Smokeless tobacco: Never Used  . Alcohol Use: Yes    Family History  Problem Relation Age of Onset  . Hypertension Maternal Grandmother   . Asthma Maternal Grandmother   . Cancer Maternal Grandmother     cancer  . Cancer Paternal Grandmother     lung    No Known Allergies  Medication list has been reviewed and updated.  No current outpatient prescriptions on file prior to visit.   No current facility-administered medications on file prior to visit.    Review of Systems:  As per HPI, otherwise negative.    Physical Examination: Filed Vitals:   09/17/13 1720  BP: 110/72  Pulse: 72  Temp: 98.7 F (37.1 C)  Resp: 16   Filed Vitals:   09/17/13 1720  Height: 5\' 3"  (1.6 m)  Weight: 169 lb (76.658 kg)   Body mass index is 29.94 kg/(m^2). Ideal Body Weight: Weight in (lb) to have BMI = 25: 140.8   GEN: WDWN, NAD, Non-toxic, Alert & Oriented x 3 HEENT: Atraumatic, Normocephalic.  Ears and Nose: No external deformity. EXTR: No clubbing/cyanosis/edema NEURO: Normal gait.  PSYCH: Normally interactive. Conversant. Not depressed or anxious appearing.  Calm demeanor.  SKIN:  Pityriasis rosea  Assessment and Plan: Pityriasis rosea acyclovir Signed,  Phillips OdorJeffery Anderson, MD

## 2013-09-17 NOTE — Patient Instructions (Signed)
Pityriasis Rosea  Pityriasis rosea is a rash which is probably caused by a virus. It generally starts as a scaly, red patch on the trunk (the area of the body that a t-shirt would cover) but does not appear on sun exposed areas. The rash is usually preceded by an initial larger spot called the "herald patch" a week or more before the rest of the rash appears. Generally within one to two days the rash appears rapidly on the trunk, upper arms, and sometimes the upper legs. The rash usually appears as flat, oval patches of scaly pink color. The rash can also be raised and one is able to feel it with a finger. The rash can also be finely crinkled and may slough off leaving a ring of scale around the spot. Sometimes a mild sore throat is present with the rash. It usually affects children and young adults in the spring and autumn. Women are more frequently affected than men.  TREATMENT   Pityriasis rosea is a self-limited condition. This means it goes away within 4 to 8 weeks without treatment. The spots may persist for several months, especially in darker-colored skin after the rash has resolved and healed. Benadryl and steroid creams may be used if itching is a problem.  SEEK MEDICAL CARE IF:   · Your rash does not go away or persists longer than three months.  · You develop fever and joint pain.  · You develop severe headache and confusion.  · You develop breathing difficulty, vomiting and/or extreme weakness.  Document Released: 03/27/2001 Document Revised: 05/13/2011 Document Reviewed: 04/15/2008  [employer]® Patient Information ©2015 [employer]. This information is not intended to replace advice given to you by your health care provider. Make sure you discuss any questions you have with your health care provider.

## 2020-05-12 DIAGNOSIS — Z01419 Encounter for gynecological examination (general) (routine) without abnormal findings: Secondary | ICD-10-CM | POA: Diagnosis not present

## 2022-05-07 ENCOUNTER — Telehealth: Payer: Self-pay

## 2022-05-07 NOTE — Telephone Encounter (Signed)
Mychart msg sent
